# Patient Record
Sex: Female | Born: 1950 | Race: White | Hispanic: No | Marital: Single | Smoking: Never smoker
Health system: Southern US, Community
[De-identification: ages and names within clinical notes are randomized; demographics above are authoritative.]

## PROBLEM LIST (undated history)

## (undated) DIAGNOSIS — I2489 Other forms of acute ischemic heart disease: Secondary | ICD-10-CM

## (undated) DIAGNOSIS — C50919 Malignant neoplasm of unspecified site of unspecified female breast: Secondary | ICD-10-CM

## (undated) DIAGNOSIS — I5021 Acute systolic (congestive) heart failure: Secondary | ICD-10-CM

## (undated) HISTORY — DX: Acute systolic (congestive) heart failure: I50.21

## (undated) HISTORY — DX: Other forms of acute ischemic heart disease: I24.89

---

## 1997-05-29 HISTORY — PX: LAPAROSCOPIC TOTAL HYSTERECTOMY: SUR800

## 1997-05-29 HISTORY — PX: BILATERAL TOTAL MASTECTOMY WITH AXILLARY LYMPH NODE DISSECTION: SHX6364

## 2021-03-10 ENCOUNTER — Other Ambulatory Visit (HOSPITAL_BASED_OUTPATIENT_CLINIC_OR_DEPARTMENT_OTHER): Payer: Self-pay

## 2021-03-10 ENCOUNTER — Ambulatory Visit: Payer: Self-pay | Attending: Internal Medicine

## 2021-03-10 DIAGNOSIS — Z23 Encounter for immunization: Secondary | ICD-10-CM

## 2021-03-10 MED ORDER — MODERNA COVID-19 BIVAL BOOSTER 50 MCG/0.5ML IM SUSP
INTRAMUSCULAR | 0 refills | Status: DC
  Filled 2021-03-10: qty 0.5, 1d supply, fill #0

## 2021-03-10 NOTE — Progress Notes (Signed)
   Covid-19 Vaccination Clinic  Name:  Theresa Hancock    MRN: [id number] DOB: [birth year]  03/10/2021  [name] was observed post Covid-19 immunization for 15 minutes without incident. She was provided with Vaccine Information Sheet and instruction to access the V-Safe system.   [name] was instructed to call 911 with any severe reactions post vaccine: Difficulty breathing  Swelling of face and throat  A fast heartbeat  A bad rash all over body  Dizziness and weakness

## 2021-03-14 ENCOUNTER — Other Ambulatory Visit (HOSPITAL_BASED_OUTPATIENT_CLINIC_OR_DEPARTMENT_OTHER): Payer: Self-pay

## 2021-10-13 ENCOUNTER — Emergency Department (HOSPITAL_COMMUNITY): Payer: Medicare Other

## 2021-10-13 ENCOUNTER — Other Ambulatory Visit: Payer: Self-pay

## 2021-10-13 ENCOUNTER — Observation Stay (HOSPITAL_COMMUNITY): Payer: Medicare Other

## 2021-10-13 ENCOUNTER — Encounter (HOSPITAL_COMMUNITY): Payer: Self-pay | Admitting: Internal Medicine

## 2021-10-13 ENCOUNTER — Inpatient Hospital Stay (HOSPITAL_COMMUNITY)
Admission: EM | Admit: 2021-10-13 | Discharge: 2021-10-16 | DRG: 280 | Disposition: A | Discharge status: Home / Self Care | Payer: Medicare Other | Attending: Internal Medicine | Admitting: Internal Medicine

## 2021-10-13 DIAGNOSIS — I2489 Other forms of acute ischemic heart disease: Secondary | ICD-10-CM

## 2021-10-13 DIAGNOSIS — Z9013 Acquired absence of bilateral breasts and nipples: Secondary | ICD-10-CM

## 2021-10-13 DIAGNOSIS — I4721 Torsades de pointes: Secondary | ICD-10-CM | POA: Diagnosis not present

## 2021-10-13 DIAGNOSIS — I447 Left bundle-branch block, unspecified: Secondary | ICD-10-CM | POA: Diagnosis present

## 2021-10-13 DIAGNOSIS — Z8249 Family history of ischemic heart disease and other diseases of the circulatory system: Secondary | ICD-10-CM

## 2021-10-13 DIAGNOSIS — Z803 Family history of malignant neoplasm of breast: Secondary | ICD-10-CM

## 2021-10-13 DIAGNOSIS — E876 Hypokalemia: Secondary | ICD-10-CM | POA: Diagnosis not present

## 2021-10-13 DIAGNOSIS — I248 Other forms of acute ischemic heart disease: Secondary | ICD-10-CM

## 2021-10-13 DIAGNOSIS — T501X5A Adverse effect of loop [high-ceiling] diuretics, initial encounter: Secondary | ICD-10-CM | POA: Diagnosis not present

## 2021-10-13 DIAGNOSIS — J9601 Acute respiratory failure with hypoxia: Secondary | ICD-10-CM

## 2021-10-13 DIAGNOSIS — E785 Hyperlipidemia, unspecified: Secondary | ICD-10-CM | POA: Diagnosis present

## 2021-10-13 DIAGNOSIS — Z66 Do not resuscitate: Secondary | ICD-10-CM | POA: Diagnosis present

## 2021-10-13 DIAGNOSIS — I21A1 Myocardial infarction type 2: Secondary | ICD-10-CM | POA: Diagnosis present

## 2021-10-13 DIAGNOSIS — I160 Hypertensive urgency: Secondary | ICD-10-CM | POA: Diagnosis present

## 2021-10-13 DIAGNOSIS — R778 Other specified abnormalities of plasma proteins: Secondary | ICD-10-CM

## 2021-10-13 DIAGNOSIS — D72829 Elevated white blood cell count, unspecified: Secondary | ICD-10-CM | POA: Diagnosis not present

## 2021-10-13 DIAGNOSIS — I340 Nonrheumatic mitral (valve) insufficiency: Secondary | ICD-10-CM | POA: Diagnosis present

## 2021-10-13 DIAGNOSIS — I5043 Acute on chronic combined systolic (congestive) and diastolic (congestive) heart failure: Secondary | ICD-10-CM | POA: Diagnosis present

## 2021-10-13 DIAGNOSIS — I161 Hypertensive emergency: Secondary | ICD-10-CM | POA: Diagnosis present

## 2021-10-13 DIAGNOSIS — I110 Hypertensive heart disease with heart failure: Secondary | ICD-10-CM | POA: Diagnosis not present

## 2021-10-13 DIAGNOSIS — Z853 Personal history of malignant neoplasm of breast: Secondary | ICD-10-CM

## 2021-10-13 DIAGNOSIS — R7989 Other specified abnormal findings of blood chemistry: Secondary | ICD-10-CM

## 2021-10-13 DIAGNOSIS — R0609 Other forms of dyspnea: Secondary | ICD-10-CM

## 2021-10-13 DIAGNOSIS — I420 Dilated cardiomyopathy: Secondary | ICD-10-CM | POA: Diagnosis present

## 2021-10-13 DIAGNOSIS — I5021 Acute systolic (congestive) heart failure: Secondary | ICD-10-CM

## 2021-10-13 HISTORY — DX: Malignant neoplasm of unspecified site of unspecified female breast: C50.919

## 2021-10-13 LAB — COMPREHENSIVE METABOLIC PANEL
ALT: 59 U/L — ABNORMAL HIGH (ref 0–44)
AST: 78 U/L — ABNORMAL HIGH (ref 15–41)
Albumin: 4.3 g/dL (ref 3.5–5.0)
Alkaline Phosphatase: 81 U/L (ref 38–126)
Anion gap: 11 (ref 5–15)
BUN: 16 mg/dL (ref 8–23)
CO2: 22 mmol/L (ref 22–32)
Calcium: 9 mg/dL (ref 8.9–10.3)
Chloride: 110 mmol/L (ref 98–111)
Creatinine, Ser: 0.64 mg/dL (ref 0.44–1.00)
GFR, Estimated: >60 mL/min (ref >60)
Glucose, Bld: 174 mg/dL — ABNORMAL HIGH (ref 70–99)
Potassium: 4.3 mmol/L (ref 3.5–5.1)
Sodium: 143 mmol/L (ref 135–145)
Total Bilirubin: 0.5 mg/dL (ref 0.3–1.2)
Total Protein: 7.3 g/dL (ref 6.5–8.1)

## 2021-10-13 LAB — TROPONIN I (HIGH SENSITIVITY)
Troponin I (High Sensitivity): 10 ng/L (ref <18)
Troponin I (High Sensitivity): 104 ng/L (ref <18)
Troponin I (High Sensitivity): 258 ng/L (ref <18)
Troponin I (High Sensitivity): 296 ng/L (ref <18)
Troponin I (High Sensitivity): 335 ng/L (ref <18)
Troponin I (High Sensitivity): 297 ng/L (ref <18)

## 2021-10-13 LAB — LIPID PANEL
Cholesterol: 216 mg/dL — ABNORMAL HIGH (ref 0–200)
HDL: 79 mg/dL (ref >40)
LDL Cholesterol: 122 mg/dL — ABNORMAL HIGH (ref 0–99)
Total CHOL/HDL Ratio: 2.7 RATIO
Triglycerides: 77 mg/dL (ref <150)
VLDL: 15 mg/dL (ref 0–40)

## 2021-10-13 LAB — TSH: TSH: 0.852 u[IU]/mL (ref 0.350–4.500)

## 2021-10-13 LAB — HEMOGLOBIN A1C
Hgb A1c MFr Bld: 5.5 % (ref 4.8–5.6)
Mean Plasma Glucose: 111.15 mg/dL

## 2021-10-13 LAB — CBC
HCT: 53.4 % — ABNORMAL HIGH (ref 36.0–46.0)
Hemoglobin: 17.2 g/dL — ABNORMAL HIGH (ref 12.0–15.0)
MCH: 31.7 pg (ref 26.0–34.0)
MCHC: 32.2 g/dL (ref 30.0–36.0)
MCV: 98.3 fL (ref 80.0–100.0)
Platelets: 293 10*3/uL (ref 150–400)
RBC: 5.43 MIL/uL — ABNORMAL HIGH (ref 3.87–5.11)
RDW: 13.2 % (ref 11.5–15.5)
WBC: 12.6 10*3/uL — ABNORMAL HIGH (ref 4.0–10.5)
nRBC: 0 % (ref 0.0–0.2)

## 2021-10-13 LAB — HEPARIN LEVEL (UNFRACTIONATED): Heparin Unfractionated: 1.09 IU/mL — ABNORMAL HIGH (ref 0.30–0.70)

## 2021-10-13 LAB — ECHOCARDIOGRAM COMPLETE: S' Lateral: 3 cm

## 2021-10-13 LAB — HIV ANTIBODY (ROUTINE TESTING W REFLEX): HIV Screen 4th Generation wRfx: NONREACTIVE

## 2021-10-13 LAB — BRAIN NATRIURETIC PEPTIDE: B Natriuretic Peptide: 54 pg/mL (ref 0.0–100.0)

## 2021-10-13 MED ORDER — SODIUM CHLORIDE 0.9 % IV SOLN
INTRAVENOUS | Status: DC
Start: 2021-10-14 — End: 2021-10-14

## 2021-10-13 MED ORDER — FUROSEMIDE 10 MG/ML IJ SOLN
40.0000 mg | Freq: Once | INTRAMUSCULAR | Status: AC
  Administered 2021-10-13: 40 mg via INTRAVENOUS
  Filled 2021-10-13: qty 4

## 2021-10-13 MED ORDER — CARVEDILOL 6.25 MG PO TABS
6.2500 mg | ORAL_TABLET | Freq: Two times a day (BID) | ORAL | Status: DC
  Administered 2021-10-14 – 2021-10-15 (×4): 6.25 mg via ORAL
  Filled 2021-10-13 (×4): qty 1

## 2021-10-13 MED ORDER — RIVAROXABAN 10 MG PO TABS
10.0000 mg | ORAL_TABLET | Freq: Every day | ORAL | Status: DC
  Administered 2021-10-13: 10 mg via ORAL
  Filled 2021-10-13: qty 1

## 2021-10-13 MED ORDER — SODIUM CHLORIDE 0.9 % IV SOLN
500.0000 mg | INTRAVENOUS | Status: DC
  Administered 2021-10-13 – 2021-10-15 (×3): 500 mg via INTRAVENOUS
  Filled 2021-10-13 (×4): qty 5

## 2021-10-13 MED ORDER — SODIUM CHLORIDE 0.9% FLUSH: 3.0000 mL | INTRAVENOUS | Status: DC | PRN

## 2021-10-13 MED ORDER — AMLODIPINE BESYLATE 5 MG PO TABS
5.0000 mg | ORAL_TABLET | Freq: Every day | ORAL | Status: DC
Start: 2021-10-13 — End: 2021-10-13
  Administered 2021-10-13: 5 mg via ORAL
  Filled 2021-10-13: qty 1

## 2021-10-13 MED ORDER — HEPARIN BOLUS VIA INFUSION
4000.0000 [IU] | Freq: Once | INTRAVENOUS | Status: AC
  Administered 2021-10-13: 4000 [IU] via INTRAVENOUS
  Filled 2021-10-13: qty 4000

## 2021-10-13 MED ORDER — IRBESARTAN 300 MG PO TABS
150.0000 mg | ORAL_TABLET | Freq: Every day | ORAL | Status: DC
  Administered 2021-10-13 – 2021-10-14 (×2): 150 mg via ORAL
  Filled 2021-10-13 (×2): qty 1

## 2021-10-13 MED ORDER — SODIUM CHLORIDE 0.9% FLUSH
3.0000 mL | Freq: Two times a day (BID) | INTRAVENOUS | Status: DC
Start: 2021-10-13 — End: 2021-10-15

## 2021-10-13 MED ORDER — SODIUM CHLORIDE 0.9 % IV SOLN
2.0000 g | Freq: Once | INTRAVENOUS | Status: AC
  Administered 2021-10-13: 2 g via INTRAVENOUS
  Filled 2021-10-13: qty 20

## 2021-10-13 MED ORDER — HEPARIN (PORCINE) 25000 UT/250ML-% IV SOLN
1000.0000 [IU]/h | INTRAVENOUS | Status: DC
  Administered 2021-10-13: 850 [IU]/h via INTRAVENOUS
  Filled 2021-10-13: qty 250

## 2021-10-13 MED ORDER — IOHEXOL 350 MG/ML SOLN
50.0000 mL | Freq: Once | INTRAVENOUS | Status: AC | PRN
Start: 2021-10-13 — End: 2021-10-13
  Administered 2021-10-13: 50 mL via INTRAVENOUS

## 2021-10-13 MED ORDER — ASPIRIN 81 MG PO CHEW
81.0000 mg | CHEWABLE_TABLET | ORAL | Status: AC
  Administered 2021-10-14: 81 mg via ORAL
  Filled 2021-10-13: qty 1

## 2021-10-13 MED ORDER — ATORVASTATIN CALCIUM 80 MG PO TABS
80.0000 mg | ORAL_TABLET | Freq: Every day | ORAL | Status: DC
  Administered 2021-10-13 – 2021-10-14 (×2): 80 mg via ORAL
  Filled 2021-10-13 (×2): qty 1

## 2021-10-13 MED ORDER — SODIUM CHLORIDE 0.9 % IV SOLN: 250.0000 mL | INTRAVENOUS | Status: DC | PRN

## 2021-10-13 MED ORDER — ASPIRIN 81 MG PO TBEC
81.0000 mg | DELAYED_RELEASE_TABLET | Freq: Every day | ORAL | Status: DC
  Administered 2021-10-13: 81 mg via ORAL
  Filled 2021-10-13: qty 1

## 2021-10-13 MED ORDER — ASPIRIN 81 MG PO TBEC
81.0000 mg | DELAYED_RELEASE_TABLET | Freq: Every day | ORAL | Status: DC
Start: 2021-10-15 — End: 2021-10-16
  Administered 2021-10-15 – 2021-10-16 (×2): 81 mg via ORAL
  Filled 2021-10-13 (×2): qty 1

## 2021-10-13 MED ORDER — NITROGLYCERIN IN D5W 200-5 MCG/ML-% IV SOLN
0.0000 ug/min | INTRAVENOUS | Status: DC
  Administered 2021-10-13: 10 ug/min via INTRAVENOUS
  Filled 2021-10-13: qty 250

## 2021-10-13 NOTE — ED Triage Notes (Signed)
Patient reports worsening SOB with wheezing / chest tightness and dry cough onset this morning , O2 sat=87% room air at arrival .

## 2021-10-13 NOTE — Progress Notes (Signed)
ANTICOAGULATION CONSULT NOTE - Follow Up Consult  Pharmacy Consult for Heparin Indication: chest pain/ACS  No Known Allergies  Patient Measurements:   Heparin Dosing Weight: 71.5 kg  Vital Signs: Temp: 98.6 F (37 C) (05/18 1452) Temp Source: Oral (05/18 1452) BP: 145/80 (05/18 1400) Pulse Rate: 89 (05/18 1400)  Labs: Recent Labs    10/13/21 0627 10/13/21 0832 10/13/21 1159  HGB 17.2*  --   --   HCT 53.4*  --   --   PLT 293  --   --   CREATININE 0.64  --   --   TROPONINIHS 10 104* 258*    CrCl cannot be calculated (Unknown ideal weight.).   Assessment: 71 yo female presented 10/13/2021 for SOB. Pharmacy consulted to dose heparin for ACS. Plan for cath tomorrow. Patient started on xarelto '10mg'$  daily for DVT prophylaxis - last dose this AM. No anticoagulation prior to admission.   Goal of Therapy:  Heparin level 0.3-0.7 units/ml Monitor platelets by anticoagulation protocol: Yes   Plan:  Heparin 4000 unit bolus followed by heparin 850 units/hr  Check 6 hr heparin level Monitor heparin level, CBC and s/s of bleeding daily  Follow cath tomorrow   Cristela Felt, PharmD, BCPS Clinical Pharmacist 10/13/2021 3:59 PM

## 2021-10-13 NOTE — H&P (Signed)
Date: 10/13/2021               Patient Name:  Theresa Hancock MRN: [id number]  DOB: 1951/04/26 Age / Sex: 71 y.o., female   PCP: Pcp, No         Medical Service: Internal Medicine Teaching Service         Attending Physician: Dr. Lottie Mussel, MD    First Contact: Idamae Schuller MD Pager: Teodora Medici [phone]  Second Contact: Linwood Dibbles, MD Pager: PA [phone]       After Hours (After 5p/  First Contact Pager: [phone]  weekends / holidays): Second Contact Pager: [phone]   SUBJECTIVE   Chief Complaint: shortness of breath  History of Present Illness: Theresa Hancock is a 71 year old female who does not follow regularly with a PCP who presents with acute onset shortness of breath.  Patient had 1 episode of shortness of breath 5 days ago and she thought it was because she was more tired.  She feels better the next day and was doing well until she woke up this morning.  She was having difficulty breathing when she woke up this morning and felt like she had fluid on her chest. She denies any chest pain .  No recent sick contacts, no fever, no cough, no history of HTN, or similar episodes.   ED Course: Hypoxic and hypertensive so patient was placed on nitro drip and BiPAP.  Patient had x-ray and CT angio chest, both consistent with bibasilar opacities which could represent edema or multifocal pneumonia. Patient weaned off Bipap and on 3 L supplemental oxygen on my interview.   Review of Systems  Constitutional:  Negative for chills and fever.  Eyes:  Negative for blurred vision and double vision.  Respiratory:  Positive for shortness of breath. Negative for cough and sputum production.   Cardiovascular:  Negative for chest pain, palpitations and leg swelling.  Gastrointestinal:  Negative for constipation and diarrhea.  Genitourinary:  Negative for frequency and urgency.  Musculoskeletal:  Negative for falls and joint pain.  Neurological:  Negative for dizziness and headaches.   Psychiatric/Behavioral:  Negative for depression and substance abuse.      Meds:  Current Meds  Medication Sig   diphenhydrAMINE (SOMINEX) 25 MG tablet Take 50 mg by mouth at bedtime as needed for sleep.    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 Nephew also lives with her.  Occupation: Case Freight forwarder.  Level of Function: Own ADL's and IADL's PCP: She is going to establish with [facility] [employer], no currently established with anyone Substances: Social History   Tobacco Use   Smoking status: Never  Substance Use Topics   Alcohol use: Yes    Alcohol/week: 7.0 standard drinks    Types: 7 Glasses of wine per week   Drug use: Never      Family History:  Family History  Problem Relation Age of Onset   Breast cancer Mother    Heart attack Father    Heart failure Brother      Allergies: Allergies as of 10/13/2021   (No Known Allergies)    Review of Systems: A complete ROS was negative except as per HPI.   OBJECTIVE:  Physical Exam: Blood pressure 131/73, pulse 87, temperature 97.8 F (36.6 C), temperature source Oral, resp. rate (!) 21, SpO2 97 %.   General: NAD, nl appearance HE: EOMI, Conjunctivae normal ENT: No congestion, no rhinorrhea, no exudate or erythema  Cardiovascular: Normal rate, regular rhythm.  No murmurs, rubs, or gallops Pulmonary : on 3 L supplemental oxygen, talking in complete sentence, unlabored, Rales in lower and middle lung fields Abdominal: soft, nontender,  bowel sounds present Musculoskeletal: no swelling , deformity, injury ,or tenderness in extremities, Skin: Warm, dry , no bruising, erythema, or rash Psychiatric/Behavioral:  normal mood, normal behavior   EKG: personally  reviewed my interpretation is NSR, LBBB. No prior comparison before this admission   ASSESSMENT & PLAN:    Assessment & Plan by Problem: Principal Problem:   Hypertensive urgency   Theresa Hancock is a 71 y.o. female who does not follow regularly with a PCP who presents with acute dyspnea.   #Severe asymptomatic hypertension -  I expect acute onset shortness of breath in setting of flash pulmonary edema from elevated blood pressure - Start olmesartan 20 mg and amlodipine 5 mg. Patient had increased response to oral medication, RN is stopping nitro drip. Goal SBP 160  and at this time goal to lower over period of days.  - Ordered TEE ,shows global hypokinesis. Mild mitral regurgitation. EF 40- 45% - Monitor on Tele - Lasix 40 mg once  #Elevated Troponin Patient has not experienced chest pain. HS Troponin initially normal and has trended up 100> 258. Expect troponin leak from hypertensive episode so have not started Heparin or given Asprin .  Repeat EKG did not show acute ischemic changes.  - Consult cardiology for ischemic eval. - Continue to trend troponin and repeat EKG - Lipid panel - hemoglobin a1c  Diet: regular  VTE: NOAC IVF: None,None Code: DNR   Signed: Drema Pry, MD Internal Medicine Resident PGY-3 Pager: [phone]  10/13/2021, 2:05 PM

## 2021-10-13 NOTE — Consult Note (Addendum)
Cardiology Consultation:   Patient ID: Theresa Hancock MRN: [id number]; DOB: Dec 13, 1950  Admit date: 10/13/2021 Date of Consult: 10/13/2021  PCP:  Merryl Hacker, No   [facility] Providers Cardiologist:  None      Patient Profile:   Theresa Hancock is a 71 y.o. female with a hx of breast cancer, who does not regularly follow with a PCP, who is being seen 10/13/2021 for the evaluation of new CHF, elevated troponin at the request of Dr. Cain Sieve.  History of Present Illness:   [name] is a 71 year old female with above medical history. Per chart review, patient does not regularly see a PCP or a cardiologist. Does not have any known cardiac history.   Patient presented to the ED on 5/18 complaining of worsening OSOB with chest tightness. Patient was hypoxic with O2 saturatoin 87% on arrival, complained multiple times that she "couldn't breath" and was started on bi-pap. Later transitioned to [facility]. Admitted to the hosptialist service. Labs in the ED showed Na 143, K 4.3, creatinine 0.64, WBC 12.6, hemoglobin 17.2, platelets 293. HsTn 10>>104>>258. BNP normal at 54.  EKG showed sinus tachycardia, HR 120, LBBB.  CTA chest negative for PE, possible multifocal PNA vs edema.  CXR concerning for acute bronchitis and developing multilobar bilateral bronchopneumonia.  On interview, patient reports that she woke up this morning and felt like she could not breath. Also had substernal chest tightness. Denied any outright chest pain, but did notice the tightness. Denies having a cough. Denies any fever, chills, n/v/d, ankle swelling. Denies noticing any SOB on exertion or chest pain on exertion recently. Denies any orthopnea. Denies any past cardiac history or history of breathing issues. Does not see a doctor regularly .   Past Medical History:  Diagnosis Date   Breast cancer [facility])     Past Surgical History:  Procedure Laterality Date   BILATERAL TOTAL MASTECTOMY WITH AXILLARY LYMPH NODE DISSECTION  Bilateral 1999   LAPAROSCOPIC TOTAL HYSTERECTOMY Bilateral 1999     Home Medications:  Prior to Admission medications   Medication Sig Start Date End Date Taking? Authorizing Provider  diphenhydrAMINE (SOMINEX) 25 MG tablet Take 50 mg by mouth at bedtime as needed for sleep.   Yes [provider]    Inpatient Medications: Scheduled Meds:  amLODipine  5 mg Oral Daily   irbesartan  150 mg Oral Daily   rivaroxaban  10 mg Oral Daily   Continuous Infusions:  azithromycin Stopped (10/13/21 9735)   nitroGLYCERIN Stopped (10/13/21 1303)   PRN Meds:   Allergies:   No Known Allergies  Social History:   Social History   Socioeconomic History   Marital status: Single    Spouse name: Not on file   Number of children: Not on file   Years of education: Not on file   Highest education level: Not on file  Occupational History   Not on file  Tobacco Use   Smoking status: Never   Smokeless tobacco: Never  Substance and Sexual Activity   Alcohol use: Yes    Alcohol/week: 7.0 standard drinks    Types: 7 Glasses of wine per week   Drug use: Never   Sexual activity: Not Currently  Other Topics Concern   Not on file  Social History Narrative   Not on file   Social Determinants of Health   Financial Resource Strain: Not on file  Food Insecurity: Not on file  Transportation Needs: Not on file  Physical Activity: Not on  file  Stress: Not on file  Social Connections: Not on file  Intimate Partner Violence: Not on file    Family History:    Family History  Problem Relation Age of Onset   Breast cancer Mother    Heart attack Father    Heart failure Brother      ROS:  Please see the history of present illness.   All other ROS reviewed and negative.     Physical Exam/Data:   Vitals:   10/13/21 1245 10/13/21 1300 10/13/21 1315 10/13/21 1316  BP: 130/85 125/70 134/69   Pulse: 89 88 87   Resp: 17 (!) 22 20   Temp:    98 F (36.7 C)  TempSrc:    Oral  SpO2:  97% 98% 98%     Intake/Output Summary (Last 24 hours) at 10/13/2021 1437 Last data filed at 10/13/2021 1303 Gross per 24 hour  Intake 405.23 ml  Output --  Net 405.23 ml       View : No data to display.           There is no height or weight on file to calculate BMI.  General:  Well nourished, well developed, in no acute distress. Sitting upright in the chair wearing nasal cannula with 3 L /min supplemental oxygen  HEENT: normal Neck: no JVD Vascular: Radial pulses 2+ bilaterally Cardiac:  normal S1, S2; RRR; no murmur  Lungs:  clear to auscultation bilaterally, no wheezing, rhonchi or rales  Abd: soft, nontender, no hepatomegaly  Ext: trace nonpitting edema Musculoskeletal:  No deformities, BUE and BLE strength normal and equal Skin: warm and dry  Neuro:  CNs 2-12 intact, no focal abnormalities noted Psych:  Normal affect   EKG:  The EKG was personally reviewed and demonstrates:  Sinus rhythm, LBBB Telemetry:  Telemetry was personally reviewed and demonstrates:    Relevant CV Studies:  Echocardiogram 10/13/21  1 . Global hypokinesis abnormal septal motion . Left ventricular ejection  fraction, by estimation, is 40 to 45%. The left ventricle has mildly  decreased function. The left ventricle demonstrates global hypokinesis.  The left ventricular internal cavity  size was mildly dilated. Left ventricular diastolic parameters are  consistent with Grade I diastolic dysfunction (impaired relaxation).   2. Right ventricular systolic function is normal. The right ventricular  size is normal.   3. Left atrial size was mildly dilated.   4. The mitral valve is abnormal. Mild mitral valve regurgitation. No  evidence of mitral stenosis.   5. The aortic valve is tricuspid. Aortic valve regurgitation is not  visualized. No aortic stenosis is present.   6. The inferior vena cava is dilated in size with >50% respiratory  variability, suggesting right atrial pressure of 8 mmHg.    Laboratory Data:  High Sensitivity Troponin:   Recent Labs  Lab 10/13/21 0627 10/13/21 0832 10/13/21 1159  TROPONINIHS 10 104* 258*     Chemistry Recent Labs  Lab 10/13/21 0627  NA 143  K 4.3  CL 110  CO2 22  GLUCOSE 174*  BUN 16  CREATININE 0.64  CALCIUM 9.0  GFRNONAA >60  ANIONGAP 11    Recent Labs  Lab 10/13/21 0627  PROT 7.3  ALBUMIN 4.3  AST 78*  ALT 59*  ALKPHOS 81  BILITOT 0.5   Lipids  Recent Labs  Lab 10/13/21 1159  CHOL 216*  TRIG 77  HDL 79  LDLCALC 122*  CHOLHDL 2.7    Hematology Recent Labs  Lab 10/13/21  0627  WBC 12.6*  RBC 5.43*  HGB 17.2*  HCT 53.4*  MCV 98.3  MCH 31.7  MCHC 32.2  RDW 13.2  PLT 293   Thyroid  Recent Labs  Lab 10/13/21 1159  TSH 0.852    BNP Recent Labs  Lab 10/13/21 0627  BNP 54.0    DDimer No results for input(s): DDIMER in the last 168 hours.   Radiology/Studies:  CT Angio Chest Pulmonary Embolism (PE) W or WO Contrast  Result Date: 10/13/2021 CLINICAL DATA:  Shortness of breath. EXAM: CT ANGIOGRAPHY CHEST WITH CONTRAST TECHNIQUE: Multidetector CT imaging of the chest was performed using the standard protocol during bolus administration of intravenous contrast. Multiplanar CT image reconstructions and MIPs were obtained to evaluate the vascular anatomy. RADIATION DOSE REDUCTION: This exam was performed according to the departmental dose-optimization program which includes automated exposure control, adjustment of the mA and/or kV according to patient size and/or use of iterative reconstruction technique. CONTRAST:  16m OMNIPAQUE IOHEXOL 350 MG/ML SOLN COMPARISON:  None Available. FINDINGS: Cardiovascular: Satisfactory opacification of the pulmonary arteries to the segmental level. No evidence of pulmonary embolism. Normal heart size. No pericardial effusion. Mediastinum/Nodes: No enlarged mediastinal, hilar, or axillary lymph nodes. Thyroid gland, trachea, and esophagus demonstrate no significant  findings. Lungs/Pleura: Minimal bilateral pleural effusions are noted. No pneumothorax is noted. Bibasilar opacities are noted concerning for edema or possibly multifocal pneumonia. Upper Abdomen: No acute abnormality. Musculoskeletal: No chest wall abnormality. No acute or significant osseous findings. Review of the MIP images confirms the above findings. IMPRESSION: No definite evidence of pulmonary embolus. Bibasilar opacities are noted concerning for edema or possibly multifocal pneumonia. Small pleural effusions are noted. Electronically Signed   By: JMarijo ConceptionM.D.   On: 10/13/2021 08:20   DG Chest Port 1 View  Result Date: 10/13/2021 CLINICAL DATA:  71year old female with history of shortness of breath. EXAM: PORTABLE CHEST 1 VIEW COMPARISON:  No priors. FINDINGS: Lung volumes are normal. Widespread areas of interstitial prominence, diffuse peribronchial cuffing and ill-defined opacities are noted throughout the lungs bilaterally, most severe throughout the mid to lower lungs. No pleural effusions. No pneumothorax. Pulmonary vasculature does not appearing origin. Heart size is upper limits of normal. Upper mediastinal contours are within normal limits. Atherosclerotic calcifications in the thoracic aorta. IMPRESSION: 1. The appearance the chest is concerning for acute bronchitis and developing multilobar bilateral bronchopneumonia, as above. 2. Aortic atherosclerosis. Electronically Signed   By: DVinnie LangtonM.D.   On: 10/13/2021 06:18   ECHOCARDIOGRAM COMPLETE  Result Date: 10/13/2021    ECHOCARDIOGRAM REPORT   Patient Name:   CJULYANNA SCHOLLEDate of Exam: 10/13/2021 Medical Rec #:  [id number]      Height:       62.0 in Accession #:    [id number]     Weight:       185.0 lb Date of Birth:  104/16/1952     BSA:          1.849 m Patient Age:    89years        BP:           136/77 mmHg Patient Gender: F               HR:           87 bpm. Exam Location:  Inpatient Procedure: 2D Echo, Color  Doppler and Cardiac Doppler Indications:    R06.9 DOE  History:  Patient has no prior history of Echocardiogram examinations.  Sonographer:    Raquel Sarna Senior RDCS Referring Phys: [id number] JULIE MACHEN  Sonographer Comments: Poor parasternal window due to scar tissue interference IMPRESSIONS  1. Global hypokinesis abnormal septal motion . Left ventricular ejection fraction, by estimation, is 40 to 45%. The left ventricle has mildly decreased function. The left ventricle demonstrates global hypokinesis. The left ventricular internal cavity size was mildly dilated. Left ventricular diastolic parameters are consistent with Grade I diastolic dysfunction (impaired relaxation).  2. Right ventricular systolic function is normal. The right ventricular size is normal.  3. Left atrial size was mildly dilated.  4. The mitral valve is abnormal. Mild mitral valve regurgitation. No evidence of mitral stenosis.  5. The aortic valve is tricuspid. Aortic valve regurgitation is not visualized. No aortic stenosis is present.  6. The inferior vena cava is dilated in size with >50% respiratory variability, suggesting right atrial pressure of 8 mmHg. FINDINGS  Left Ventricle: Global hypokinesis abnormal septal motion. Left ventricular ejection fraction, by estimation, is 40 to 45%. The left ventricle has mildly decreased function. The left ventricle demonstrates global hypokinesis. The left ventricular internal cavity size was mildly dilated. There is no left ventricular hypertrophy. Left ventricular diastolic parameters are consistent with Grade I diastolic dysfunction (impaired relaxation). Right Ventricle: The right ventricular size is normal. No increase in right ventricular wall thickness. Right ventricular systolic function is normal. Left Atrium: Left atrial size was mildly dilated. Right Atrium: Right atrial size was normal in size. Pericardium: There is no evidence of pericardial effusion. Mitral Valve: The mitral valve is  abnormal. There is mild thickening of the mitral valve leaflet(s). Mild mitral valve regurgitation. No evidence of mitral valve stenosis. Tricuspid Valve: The tricuspid valve is normal in structure. Tricuspid valve regurgitation is not demonstrated. No evidence of tricuspid stenosis. Aortic Valve: The aortic valve is tricuspid. Aortic valve regurgitation is not visualized. No aortic stenosis is present. Pulmonic Valve: The pulmonic valve was normal in structure. Pulmonic valve regurgitation is not visualized. No evidence of pulmonic stenosis. Aorta: The aortic root is normal in size and structure. Venous: The inferior vena cava is dilated in size with greater than 50% respiratory variability, suggesting right atrial pressure of 8 mmHg. IAS/Shunts: No atrial level shunt detected by color flow Doppler.  LEFT VENTRICLE PLAX 2D LVIDd:         4.40 cm   Diastology LVIDs:         3.00 cm   LV e' medial:  5.98 cm/s LV PW:         0.90 cm   LV e' lateral: 7.94 cm/s LV IVS:        0.90 cm LVOT diam:     2.00 cm LV SV:         60 LV SV Index:   32 LVOT Area:     3.14 cm  RIGHT VENTRICLE RV S prime:     12.20 cm/s TAPSE (M-mode): 2.4 cm LEFT ATRIUM             Index        RIGHT ATRIUM           Index LA diam:        4.00 cm 2.16 cm/m   RA Area:     13.80 cm LA Vol (A2C):   43.0 ml 23.25 ml/m  RA Volume:   32.80 ml  17.74 ml/m LA Vol (A4C):   46.3 ml 25.04 ml/m LA  Biplane Vol: 47.4 ml 25.63 ml/m  AORTIC VALVE LVOT Vmax:   101.00 cm/s LVOT Vmean:  69.100 cm/s LVOT VTI:    0.190 m  AORTA Ao Root diam: 2.70 cm Ao Asc diam:  3.10 cm  SHUNTS Systemic VTI:  0.19 m Systemic Diam: 2.00 cm Jenkins Rouge MD Electronically signed by Jenkins Rouge MD Signature Date/Time: 10/13/2021/11:46:52 AM    Final      Assessment and Plan:   Elevated Troponin  - hsTn 10>>104>>258  - Patient reports acute onset of SOB and chest tightness this AM  - Patient noted to have high BP on arrival, possible this is demand ischemia/troponin leak due  to htn urgency. However I am concerned that her trops are trending upward and patient does have uncontrolled risk factors (HTN, HLD, possible DM). Also concerned about acute onset of symptoms this AM, newly reduced EF on echo with RWMA - EKG with LBBB, difficult to evaluate for ischemic changes  - Start IV heparin, daily ASA, statin  - Plan for R/L heart cath tomorrow   HTN urgency  - Initial BP 232/140 - Patient started on amlodipine, irbesartan, IV nitro  - BP improving, most recently 145/80  Acute HFrEF  - Echo this admission showed global hypokinesis with abnormal septal motion, LVEF 62-95%, grade I diastolic dysfunction - Unknown etiology-- possibly secondary to uncontrolled HTN ? Brother also had history of HF (thought to be genetic)  - Continue ARB  - Given lasix '40mg'$  IV x1 dose, appears overall euvolemic on exam  - Needs ischemic eval. Given elevated/upward trending troponin, Plan for L/R heart cath this admission   HLD  - LDL 122, HDL 79, triglycerides 77  - Start lipitor 80 mg daily. Needs LFTs and lipid panel in 8 weeks    Risk Assessment/Risk Scores:    New York Heart Association (NYHA) Functional Class NYHA Class II    For questions or updates, please contact [facility] Please consult [email or web address] for contact info under    Signed, Margie Billet, PA-C  10/13/2021 2:37 PM   Patient seen and examined.  Agree with below documentation.  Ms. Binion is a 71 year old female with a history of breast cancer who we are consulted for evaluation of heart failure and troponin elevation at the request of Dr. Cain Sieve.  She does not have regular medical follow-up.  She presented to the ED today with complaints of chest pain.  Reports she woke up this morning and felt short of breath and had chest pain that she describes as tightness in center of chest.  In the ED, initial vital signs notable for BP 232/140, pulse 134, SPO2 88% on room air.  She was started on BiPAP and  weaned to nasal cannula.  EKG showed sinus tachycardia, left bundle branch block.  Labs notable for creatinine 0.6, WBC 12.6, hemoglobin 17.2, platelets 293, BNP 54, troponin 10 > 104 > 258 > 296, LDL 122.  CTPA showed no PE, small pleural effusions, bibasilar opacities concerning for edema or possible multifocal pneumonia.  Echocardiogram showed EF 40 to 45%, normal RV function, mild MR.  On exam, patient is alert and oriented, regular rate and rhythm, no murmurs, lungs CTAB, no LE edema or JVD.  Suspect her troponin elevation represents demand ischemia in setting of her severely elevated blood pressure on admission.  However given her chest pain and also with new systolic dysfunction on echocardiogram, warrants ischemic evaluation.  Recommend RHC/LHC tomorrow.  She was started on amlodipine and irbesartan  for BP control.  We will switch amlodipine to carvedilol 6.25 mg twice daily given her systolic dysfunction.  Risks and benefits of cardiac catheterization have been discussed with the patient.  These include bleeding, infection, kidney damage, stroke, heart attack, death.  The patient understands these risks and is willing to proceed.   Donato Heinz, MD

## 2021-10-13 NOTE — Progress Notes (Signed)
Echocardiogram 2D Echocardiogram has been performed.  Oneal Deputy Anzley Dibbern RDCS 10/13/2021, 11:23 AM

## 2021-10-13 NOTE — Progress Notes (Signed)
Recent troponin now up to 335 at 19:31. Last was 296 at 16:30. Pt denies chest pain. Heparin drip infusing. Pt scheduled for left and right heart cath in AM. MD notified.

## 2021-10-13 NOTE — Progress Notes (Signed)
RT called by RN stating that BiPAP machine alarm sounding. RN removed pt from BiPAP and placed pt on 15L NRB. RT at bedside to find pt on 15L NRB SpO2 reading 100%. Pt states her breathing feels better. RT place pt on 3L [place]. Pt tolerating well at this time. BiPAP on stby in room. RT will continue to monitor pt.

## 2021-10-13 NOTE — ED Notes (Signed)
Switched to 5LPM [place] at this time. Pt maintains at 95-96% on the monitor. RT was at bedside. Will continue to monitor.

## 2021-10-13 NOTE — ED Notes (Signed)
Repeat EKG done at this time. Ordering MD messaged at this time that result is available.

## 2021-10-13 NOTE — ED Notes (Signed)
O2 decreased to 3LPM [place]. Pt tolerating well. O2 at 92-96% on the monitor.

## 2021-10-13 NOTE — ED Provider Notes (Signed)
[facility] Emergency Department Provider Note MRN:  [id number]  Arrival date & time: 10/13/21     Chief Complaint   Shortness of Breath   History of Present Illness   Theresa Hancock is a 71 y.o. year-old female with no pertinent past medical history presenting to the ED with chief complaint of shortness of breath.  Severe shortness of breath, sudden onset a few hours ago.  Had some more mild shortness of breath or dyspnea on exertion for the past 2 days.  Denies having any chest pain over the past few days, no fever, no cough.  No history of asthma or COPD, no history of heart problems.  Review of Systems  A thorough review of systems was obtained and all systems are negative except as noted in the HPI and PMH.   Patient's Health History   No past medical history on file.    No family history on file.  Social History   Socioeconomic History   Marital status: Single    Spouse name: Not on file   Number of children: Not on file   Years of education: Not on file   Highest education level: Not on file  Occupational History   Not on file  Tobacco Use   Smoking status: Not on file   Smokeless tobacco: Not on file  Substance and Sexual Activity   Alcohol use: Not on file   Drug use: Not on file   Sexual activity: Not on file  Other Topics Concern   Not on file  Social History Narrative   Not on file   Social Determinants of Health   Financial Resource Strain: Not on file  Food Insecurity: Not on file  Transportation Needs: Not on file  Physical Activity: Not on file  Stress: Not on file  Social Connections: Not on file  Intimate Partner Violence: Not on file     Physical Exam   Vitals:   10/13/21 0630 10/13/21 0645  BP: (!) 145/132 (!) 169/115  Pulse: (!) 106 (!) 104  Resp: (!) 23 (!) 24  SpO2: 95% 98%    CONSTITUTIONAL: Ill-appearing, in severe respiratory distress NEURO/PSYCH:  Alert and oriented x 3, no focal deficits EYES:   eyes equal and reactive ENT/NECK:  no LAD, no JVD CARDIO: Tachycardic rate, well-perfused, normal S1 and S2 PULM: Diffuse rhonchi, tachypneic, accessory muscle use GI/GU:  non-distended, non-tender MSK/SPINE:  No gross deformities, no edema SKIN:  no rash, atraumatic   *Additional and/or pertinent findings included in MDM below  Diagnostic and Interventional Summary    EKG Interpretation  Date/Time:  Thursday Oct 13 2021 06:12:30 EDT Ventricular Rate:  120 PR Interval:  83 QRS Duration: 141 QT Interval:  369 QTC Calculation: 522 R Axis:   252 Text Interpretation: Sinus tachycardia Left bundle branch block Nonspecific IVCD with LAD Inferior infarct, acute Anterior infarct, acute (LAD) Lateral leads are also involved Confirmed by Gerlene Fee [phone]) on 10/13/2021 6:16:54 AM       Labs Reviewed  CBC - Abnormal; Notable for the following components:      Result Value   WBC 12.6 (*)    RBC 5.43 (*)    Hemoglobin 17.2 (*)    HCT 53.4 (*)    All other components within normal limits  COMPREHENSIVE METABOLIC PANEL  BRAIN NATRIURETIC PEPTIDE  TROPONIN I (HIGH SENSITIVITY)    DG Chest Port 1 View  Final Result    CT Angio Chest Pulmonary Embolism (PE)  W or WO Contrast    (Results Pending)    Medications  nitroGLYCERIN 50 mg in dextrose 5 % 250 mL (0.2 mg/mL) infusion (10 mcg/min Intravenous New Bag/Given 10/13/21 0615)  cefTRIAXone (ROCEPHIN) 2 g in sodium chloride 0.9 % 100 mL IVPB (has no administration in time range)  azithromycin (ZITHROMAX) 500 mg in sodium chloride 0.9 % 250 mL IVPB (has no administration in time range)     Procedures  /  Critical Care .Critical Care Performed by: Maudie Flakes, MD Authorized by: Maudie Flakes, MD   Critical care provider statement:    Critical care time (minutes):  45   Critical care was necessary to treat or prevent imminent or life-threatening deterioration of the following conditions:  Respiratory failure   Critical care  was time spent personally by me on the following activities:  Development of treatment plan with patient or surrogate, discussions with consultants, evaluation of patient's response to treatment, examination of patient, ordering and review of laboratory studies, ordering and review of radiographic studies, ordering and performing treatments and interventions, pulse oximetry, re-evaluation of patient's condition and review of old charts  ED Course and Medical Decision Making  Initial Impression and Ddx Clinical impression is flash pulmonary edema, unclear cause as patient has no history of cardiac problems.  Suspect new onset heart failure.  Patient arrives with diffuse rhonchi, hypoxic respiratory failure, severely hypertensive.  Denies any chest pain.  EKG demonstrating a left bundle branch block, sinus tachycardia, possibly some signs of ischemia but not meeting STEMI criteria.  Initiating BiPAP, nitro drip.  Past medical/surgical history that increases complexity of ED encounter: None  Interpretation of Diagnostics I personally reviewed the EKG and my interpretation is as follows: Sinus tachycardia, left bundle branch block, discordant ST changes    Labs pending.  Chest x-ray interpreted as pneumonia rather than CHF.  Patient Reassessment and Ultimate Disposition/Management Awaiting labs, CTA chest to evaluate for PE, edema, pneumonia.  Will need admission, signed out to oncoming provider.  Patient management required discussion with the following services or consulting groups:  None  Complexity of Problems Addressed Acute illness or injury that poses threat of life of bodily function  Additional Data Reviewed and Analyzed Further history obtained from: EMS on arrival  Additional Factors Impacting ED Encounter Risk Consideration of hospitalization  [name], [place] [email or web address]  Final Clinical Impressions(s) / ED  Diagnoses     ICD-10-CM   1. Acute respiratory failure with hypoxia (HCC)  J96.01       ED Discharge Orders     None        Discharge Instructions Discussed with and Provided to Patient:   Discharge Instructions   None      Maudie Flakes, MD 10/13/21 [phone]

## 2021-10-13 NOTE — ED Notes (Signed)
Phlebotomist is coming to draw labs.

## 2021-10-13 NOTE — ED Provider Notes (Signed)
I received the patient in signout from Dr. Maurie Boettcher.  Briefly the patient is a 71 year old female with a chief complaints of acute onset shortness of breath.  This occurred this morning.  Patient presented most typically with acute pulmonary edema found to be very tachypneic hypoxic and hypertensive.  Improved with BiPAP and nitro drip.  Patient has been taken off of BiPAP and is now on 3 L of oxygen.  She tells me that she feels much better.  Initial chest x-ray independently interpreted by me as acute fluid overload but read by radiology as possible multifocal pneumonia.  Plan for CT to better visualize.  Will discuss with medicine for admission.     Deno Etienne, DO 10/13/21 [phone]

## 2021-10-13 NOTE — ED Notes (Signed)
Patient to ER room 34. Patient is diaphoretic, tachypneic, tachycardic, and very restless. Patient cannot sit still and continuously attempts to yell "I cant breath." Physician and multiple Rns at patient bedside. Respiratory places patient on bipap and within 2-3 minutes patient reports she feels much better and is resting. Call bell in reach. Patient remains on bedside monitor.

## 2021-10-13 NOTE — ED Notes (Signed)
Spoke to Dr.Steen on the phone with plan to dc nitro drip and watch bp. Notify MD when bp goes over 967 systolic. Pt is alert and oriented x 4. Not in any distress. Down to 2LPM [place] with O2 at 96-98% on the monitor. Continues to endorse some mild shortness of breath at rest. Will continue to monitor.

## 2021-10-13 NOTE — H&P (View-Only) (Signed)
Cardiology Consultation:   Patient ID: Theresa Hancock MRN: [id number]; DOB: [birth year]  Admit date: 10/13/2021 Date of Consult: 10/13/2021  PCP:  Merryl Hacker, No   [facility] Providers Cardiologist:  None      Patient Profile:   Theresa Hancock is a 71 y.o. female with a hx of breast cancer, who does not regularly follow with a PCP, who is being seen 10/13/2021 for the evaluation of new CHF, elevated troponin at the request of Dr. Cain Sieve.  History of Present Illness:   Theresa Hancock is a 71 year old female with above medical history. Per chart review, patient does not regularly see a PCP or a cardiologist. Does not have any known cardiac history.   Patient presented to the ED on 5/18 complaining of worsening OSOB with chest tightness. Patient was hypoxic with O2 saturatoin 87% on arrival, complained multiple times that she "couldn't breath" and was started on bi-pap. Later transitioned to [facility]. Admitted to the hosptialist service. Labs in the ED showed Na 143, K 4.3, creatinine 0.64, WBC 12.6, hemoglobin 17.2, platelets 293. HsTn 10>>104>>258. BNP normal at 54.  EKG showed sinus tachycardia, HR 120, LBBB.  CTA chest negative for PE, possible multifocal PNA vs edema.  CXR concerning for acute bronchitis and developing multilobar bilateral bronchopneumonia.  On interview, patient reports that she woke up this morning and felt like she could not breath. Also had substernal chest tightness. Denied any outright chest pain, but did notice the tightness. Denies having a cough. Denies any fever, chills, n/v/d, ankle swelling. Denies noticing any SOB on exertion or chest pain on exertion recently. Denies any orthopnea. Denies any past cardiac history or history of breathing issues. Does not see a doctor regularly .   Past Medical History:  Diagnosis Date   Breast cancer [facility])     Past Surgical History:  Procedure Laterality Date   BILATERAL TOTAL MASTECTOMY WITH AXILLARY LYMPH NODE DISSECTION  Bilateral 1999   LAPAROSCOPIC TOTAL HYSTERECTOMY Bilateral 1999     Home Medications:  Prior to Admission medications   Medication Sig Start Date End Date Taking? Authorizing Provider  diphenhydrAMINE (SOMINEX) 25 MG tablet Take 50 mg by mouth at bedtime as needed for sleep.   Yes [provider]    Inpatient Medications: Scheduled Meds:  amLODipine  5 mg Oral Daily   irbesartan  150 mg Oral Daily   rivaroxaban  10 mg Oral Daily   Continuous Infusions:  azithromycin Stopped (10/13/21 6789)   nitroGLYCERIN Stopped (10/13/21 1303)   PRN Meds:   Allergies:   No Known Allergies  Social History:   Social History   Socioeconomic History   Marital status: Single    Spouse name: Not on file   Number of children: Not on file   Years of education: Not on file   Highest education level: Not on file  Occupational History   Not on file  Tobacco Use   Smoking status: Never   Smokeless tobacco: Never  Substance and Sexual Activity   Alcohol use: Yes    Alcohol/week: 7.0 standard drinks    Types: 7 Glasses of wine per week   Drug use: Never   Sexual activity: Not Currently  Other Topics Concern   Not on file  Social History Narrative   Not on file   Social Determinants of Health   Financial Resource Strain: Not on file  Food Insecurity: Not on file  Transportation Needs: Not on file  Physical Activity: Not on  file  Stress: Not on file  Social Connections: Not on file  Intimate Partner Violence: Not on file    Family History:    Family History  Problem Relation Age of Onset   Breast cancer Mother    Heart attack Father    Heart failure Brother      ROS:  Please see the history of present illness.   All other ROS reviewed and negative.     Physical Exam/Data:   Vitals:   10/13/21 1245 10/13/21 1300 10/13/21 1315 10/13/21 1316  BP: 130/85 125/70 134/69   Pulse: 89 88 87   Resp: 17 (!) 22 20   Temp:    98 F (36.7 C)  TempSrc:    Oral  SpO2:  97% 98% 98%     Intake/Output Summary (Last 24 hours) at 10/13/2021 1437 Last data filed at 10/13/2021 1303 Gross per 24 hour  Intake 405.23 ml  Output --  Net 405.23 ml       View : No data to display.           There is no height or weight on file to calculate BMI.  General:  Well nourished, well developed, in no acute distress. Sitting upright in the chair wearing nasal cannula with 3 L /min supplemental oxygen  HEENT: normal Neck: no JVD Vascular: Radial pulses 2+ bilaterally Cardiac:  normal S1, S2; RRR; no murmur  Lungs:  clear to auscultation bilaterally, no wheezing, rhonchi or rales  Abd: soft, nontender, no hepatomegaly  Ext: trace nonpitting edema Musculoskeletal:  No deformities, BUE and BLE strength normal and equal Skin: warm and dry  Neuro:  CNs 2-12 intact, no focal abnormalities noted Psych:  Normal affect   EKG:  The EKG was personally reviewed and demonstrates:  Sinus rhythm, LBBB Telemetry:  Telemetry was personally reviewed and demonstrates:    Relevant CV Studies:  Echocardiogram 10/13/21  1 . Global hypokinesis abnormal septal motion . Left ventricular ejection  fraction, by estimation, is 40 to 45%. The left ventricle has mildly  decreased function. The left ventricle demonstrates global hypokinesis.  The left ventricular internal cavity  size was mildly dilated. Left ventricular diastolic parameters are  consistent with Grade I diastolic dysfunction (impaired relaxation).   2. Right ventricular systolic function is normal. The right ventricular  size is normal.   3. Left atrial size was mildly dilated.   4. The mitral valve is abnormal. Mild mitral valve regurgitation. No  evidence of mitral stenosis.   5. The aortic valve is tricuspid. Aortic valve regurgitation is not  visualized. No aortic stenosis is present.   6. The inferior vena cava is dilated in size with >50% respiratory  variability, suggesting right atrial pressure of 8 mmHg.    Laboratory Data:  High Sensitivity Troponin:   Recent Labs  Lab 10/13/21 0627 10/13/21 0832 10/13/21 1159  TROPONINIHS 10 104* 258*     Chemistry Recent Labs  Lab 10/13/21 0627  NA 143  K 4.3  CL 110  CO2 22  GLUCOSE 174*  BUN 16  CREATININE 0.64  CALCIUM 9.0  GFRNONAA >60  ANIONGAP 11    Recent Labs  Lab 10/13/21 0627  PROT 7.3  ALBUMIN 4.3  AST 78*  ALT 59*  ALKPHOS 81  BILITOT 0.5   Lipids  Recent Labs  Lab 10/13/21 1159  CHOL 216*  TRIG 77  HDL 79  LDLCALC 122*  CHOLHDL 2.7    Hematology Recent Labs  Lab 10/13/21  0627  WBC 12.6*  RBC 5.43*  HGB 17.2*  HCT 53.4*  MCV 98.3  MCH 31.7  MCHC 32.2  RDW 13.2  PLT 293   Thyroid  Recent Labs  Lab 10/13/21 1159  TSH 0.852    BNP Recent Labs  Lab 10/13/21 0627  BNP 54.0    DDimer No results for input(s): DDIMER in the last 168 hours.   Radiology/Studies:  CT Angio Chest Pulmonary Embolism (PE) W or WO Contrast  Result Date: 10/13/2021 CLINICAL DATA:  Shortness of breath. EXAM: CT ANGIOGRAPHY CHEST WITH CONTRAST TECHNIQUE: Multidetector CT imaging of the chest was performed using the standard protocol during bolus administration of intravenous contrast. Multiplanar CT image reconstructions and MIPs were obtained to evaluate the vascular anatomy. RADIATION DOSE REDUCTION: This exam was performed according to the departmental dose-optimization program which includes automated exposure control, adjustment of the mA and/or kV according to patient size and/or use of iterative reconstruction technique. CONTRAST:  21m OMNIPAQUE IOHEXOL 350 MG/ML SOLN COMPARISON:  None Available. FINDINGS: Cardiovascular: Satisfactory opacification of the pulmonary arteries to the segmental level. No evidence of pulmonary embolism. Normal heart size. No pericardial effusion. Mediastinum/Nodes: No enlarged mediastinal, hilar, or axillary lymph nodes. Thyroid gland, trachea, and esophagus demonstrate no significant  findings. Lungs/Pleura: Minimal bilateral pleural effusions are noted. No pneumothorax is noted. Bibasilar opacities are noted concerning for edema or possibly multifocal pneumonia. Upper Abdomen: No acute abnormality. Musculoskeletal: No chest wall abnormality. No acute or significant osseous findings. Review of the MIP images confirms the above findings. IMPRESSION: No definite evidence of pulmonary embolus. Bibasilar opacities are noted concerning for edema or possibly multifocal pneumonia. Small pleural effusions are noted. Electronically Signed   By: JMarijo ConceptionM.D.   On: 10/13/2021 08:20   DG Chest Port 1 View  Result Date: 10/13/2021 CLINICAL DATA:  71year old female with history of shortness of breath. EXAM: PORTABLE CHEST 1 VIEW COMPARISON:  No priors. FINDINGS: Lung volumes are normal. Widespread areas of interstitial prominence, diffuse peribronchial cuffing and ill-defined opacities are noted throughout the lungs bilaterally, most severe throughout the mid to lower lungs. No pleural effusions. No pneumothorax. Pulmonary vasculature does not appearing origin. Heart size is upper limits of normal. Upper mediastinal contours are within normal limits. Atherosclerotic calcifications in the thoracic aorta. IMPRESSION: 1. The appearance the chest is concerning for acute bronchitis and developing multilobar bilateral bronchopneumonia, as above. 2. Aortic atherosclerosis. Electronically Signed   By: DVinnie LangtonM.D.   On: 10/13/2021 06:18   ECHOCARDIOGRAM COMPLETE  Result Date: 10/13/2021    ECHOCARDIOGRAM REPORT   Patient Name:   Theresa SANJOSEDate of Exam: 10/13/2021 Medical Rec #:  [id number]      Height:       62.0 in Accession #:    [id number]     Weight:       185.0 lb Date of Birth:  [birth year]     BSA:          1.849 m Patient Age:    89years        BP:           136/77 mmHg Patient Gender: F               HR:           87 bpm. Exam Location:  Inpatient Procedure: 2D Echo, Color  Doppler and Cardiac Doppler Indications:    R06.9 DOE  History:  Patient has no prior history of Echocardiogram examinations.  Sonographer:    Raquel Sarna Senior RDCS Referring Phys: [id number] JULIE MACHEN  Sonographer Comments: Poor parasternal window due to scar tissue interference IMPRESSIONS  1. Global hypokinesis abnormal septal motion . Left ventricular ejection fraction, by estimation, is 40 to 45%. The left ventricle has mildly decreased function. The left ventricle demonstrates global hypokinesis. The left ventricular internal cavity size was mildly dilated. Left ventricular diastolic parameters are consistent with Grade I diastolic dysfunction (impaired relaxation).  2. Right ventricular systolic function is normal. The right ventricular size is normal.  3. Left atrial size was mildly dilated.  4. The mitral valve is abnormal. Mild mitral valve regurgitation. No evidence of mitral stenosis.  5. The aortic valve is tricuspid. Aortic valve regurgitation is not visualized. No aortic stenosis is present.  6. The inferior vena cava is dilated in size with >50% respiratory variability, suggesting right atrial pressure of 8 mmHg. FINDINGS  Left Ventricle: Global hypokinesis abnormal septal motion. Left ventricular ejection fraction, by estimation, is 40 to 45%. The left ventricle has mildly decreased function. The left ventricle demonstrates global hypokinesis. The left ventricular internal cavity size was mildly dilated. There is no left ventricular hypertrophy. Left ventricular diastolic parameters are consistent with Grade I diastolic dysfunction (impaired relaxation). Right Ventricle: The right ventricular size is normal. No increase in right ventricular wall thickness. Right ventricular systolic function is normal. Left Atrium: Left atrial size was mildly dilated. Right Atrium: Right atrial size was normal in size. Pericardium: There is no evidence of pericardial effusion. Mitral Valve: The mitral valve is  abnormal. There is mild thickening of the mitral valve leaflet(s). Mild mitral valve regurgitation. No evidence of mitral valve stenosis. Tricuspid Valve: The tricuspid valve is normal in structure. Tricuspid valve regurgitation is not demonstrated. No evidence of tricuspid stenosis. Aortic Valve: The aortic valve is tricuspid. Aortic valve regurgitation is not visualized. No aortic stenosis is present. Pulmonic Valve: The pulmonic valve was normal in structure. Pulmonic valve regurgitation is not visualized. No evidence of pulmonic stenosis. Aorta: The aortic root is normal in size and structure. Venous: The inferior vena cava is dilated in size with greater than 50% respiratory variability, suggesting right atrial pressure of 8 mmHg. IAS/Shunts: No atrial level shunt detected by color flow Doppler.  LEFT VENTRICLE PLAX 2D LVIDd:         4.40 cm   Diastology LVIDs:         3.00 cm   LV e' medial:  5.98 cm/s LV PW:         0.90 cm   LV e' lateral: 7.94 cm/s LV IVS:        0.90 cm LVOT diam:     2.00 cm LV SV:         60 LV SV Index:   32 LVOT Area:     3.14 cm  RIGHT VENTRICLE RV S prime:     12.20 cm/s TAPSE (M-mode): 2.4 cm LEFT ATRIUM             Index        RIGHT ATRIUM           Index LA diam:        4.00 cm 2.16 cm/m   RA Area:     13.80 cm LA Vol (A2C):   43.0 ml 23.25 ml/m  RA Volume:   32.80 ml  17.74 ml/m LA Vol (A4C):   46.3 ml 25.04 ml/m LA  Biplane Vol: 47.4 ml 25.63 ml/m  AORTIC VALVE LVOT Vmax:   101.00 cm/s LVOT Vmean:  69.100 cm/s LVOT VTI:    0.190 m  AORTA Ao Root diam: 2.70 cm Ao Asc diam:  3.10 cm  SHUNTS Systemic VTI:  0.19 m Systemic Diam: 2.00 cm Jenkins Rouge MD Electronically signed by Jenkins Rouge MD Signature Date/Time: 10/13/2021/11:46:52 AM    Final      Assessment and Plan:   Elevated Troponin  - hsTn 10>>104>>258  - Patient reports acute onset of SOB and chest tightness this AM  - Patient noted to have high BP on arrival, possible this is demand ischemia/troponin leak due  to htn urgency. However I am concerned that her trops are trending upward and patient does have uncontrolled risk factors (HTN, HLD, possible DM). Also concerned about acute onset of symptoms this AM, newly reduced EF on echo with RWMA - EKG with LBBB, difficult to evaluate for ischemic changes  - Start IV heparin, daily ASA, statin  - Plan for R/L heart cath tomorrow   HTN urgency  - Initial BP 232/140 - Patient started on amlodipine, irbesartan, IV nitro  - BP improving, most recently 145/80  Acute HFrEF  - Echo this admission showed global hypokinesis with abnormal septal motion, LVEF 51-76%, grade I diastolic dysfunction - Unknown etiology-- possibly secondary to uncontrolled HTN ? Brother also had history of HF (thought to be genetic)  - Continue ARB  - Given lasix '40mg'$  IV x1 dose, appears overall euvolemic on exam  - Needs ischemic eval. Given elevated/upward trending troponin, Plan for L/R heart cath this admission   HLD  - LDL 122, HDL 79, triglycerides 77  - Start lipitor 80 mg daily. Needs LFTs and lipid panel in 8 weeks    Risk Assessment/Risk Scores:    New York Heart Association (NYHA) Functional Class NYHA Class II    For questions or updates, please contact [facility] Please consult [email or web address] for contact info under    Signed, Margie Billet, PA-C  10/13/2021 2:37 PM   Patient seen and examined.  Agree with below documentation.  Theresa Hancock is a 71 year old female with a history of breast cancer who we are consulted for evaluation of heart failure and troponin elevation at the request of Dr. Cain Sieve.  She does not have regular medical follow-up.  She presented to the ED today with complaints of chest pain.  Reports she woke up this morning and felt short of breath and had chest pain that she describes as tightness in center of chest.  In the ED, initial vital signs notable for BP 232/140, pulse 134, SPO2 88% on room air.  She was started on BiPAP and  weaned to nasal cannula.  EKG showed sinus tachycardia, left bundle branch block.  Labs notable for creatinine 0.6, WBC 12.6, hemoglobin 17.2, platelets 293, BNP 54, troponin 10 > 104 > 258 > 296, LDL 122.  CTPA showed no PE, small pleural effusions, bibasilar opacities concerning for edema or possible multifocal pneumonia.  Echocardiogram showed EF 40 to 45%, normal RV function, mild MR.  On exam, patient is alert and oriented, regular rate and rhythm, no murmurs, lungs CTAB, no LE edema or JVD.  Suspect her troponin elevation represents demand ischemia in setting of her severely elevated blood pressure on admission.  However given her chest pain and also with new systolic dysfunction on echocardiogram, warrants ischemic evaluation.  Recommend RHC/LHC tomorrow.  She was started on amlodipine and irbesartan  for BP control.  We will switch amlodipine to carvedilol 6.25 mg twice daily given her systolic dysfunction.  Risks and benefits of cardiac catheterization have been discussed with the patient.  These include bleeding, infection, kidney damage, stroke, heart attack, death.  The patient understands these risks and is willing to proceed.   Donato Heinz, MD

## 2021-10-14 ENCOUNTER — Encounter (HOSPITAL_COMMUNITY)
Admission: EM | Disposition: A | Discharge status: Home / Self Care | Payer: Self-pay | Source: Home / Self Care | Attending: Internal Medicine

## 2021-10-14 ENCOUNTER — Other Ambulatory Visit (HOSPITAL_COMMUNITY): Payer: Self-pay

## 2021-10-14 DIAGNOSIS — Z8249 Family history of ischemic heart disease and other diseases of the circulatory system: Secondary | ICD-10-CM | POA: Diagnosis not present

## 2021-10-14 DIAGNOSIS — I110 Hypertensive heart disease with heart failure: Secondary | ICD-10-CM | POA: Diagnosis present

## 2021-10-14 DIAGNOSIS — Z66 Do not resuscitate: Secondary | ICD-10-CM | POA: Diagnosis present

## 2021-10-14 DIAGNOSIS — J9601 Acute respiratory failure with hypoxia: Secondary | ICD-10-CM | POA: Diagnosis present

## 2021-10-14 DIAGNOSIS — T501X5A Adverse effect of loop [high-ceiling] diuretics, initial encounter: Secondary | ICD-10-CM | POA: Diagnosis not present

## 2021-10-14 DIAGNOSIS — I4721 Torsades de pointes: Secondary | ICD-10-CM | POA: Diagnosis not present

## 2021-10-14 DIAGNOSIS — I161 Hypertensive emergency: Secondary | ICD-10-CM | POA: Diagnosis present

## 2021-10-14 DIAGNOSIS — Z9013 Acquired absence of bilateral breasts and nipples: Secondary | ICD-10-CM | POA: Diagnosis not present

## 2021-10-14 DIAGNOSIS — I5043 Acute on chronic combined systolic (congestive) and diastolic (congestive) heart failure: Secondary | ICD-10-CM | POA: Diagnosis present

## 2021-10-14 DIAGNOSIS — Z853 Personal history of malignant neoplasm of breast: Secondary | ICD-10-CM | POA: Diagnosis not present

## 2021-10-14 DIAGNOSIS — I160 Hypertensive urgency: Secondary | ICD-10-CM | POA: Diagnosis present

## 2021-10-14 DIAGNOSIS — E876 Hypokalemia: Secondary | ICD-10-CM | POA: Diagnosis not present

## 2021-10-14 DIAGNOSIS — I5041 Acute combined systolic (congestive) and diastolic (congestive) heart failure: Secondary | ICD-10-CM

## 2021-10-14 DIAGNOSIS — E785 Hyperlipidemia, unspecified: Secondary | ICD-10-CM | POA: Diagnosis present

## 2021-10-14 DIAGNOSIS — I340 Nonrheumatic mitral (valve) insufficiency: Secondary | ICD-10-CM | POA: Diagnosis present

## 2021-10-14 DIAGNOSIS — D72829 Elevated white blood cell count, unspecified: Secondary | ICD-10-CM | POA: Diagnosis not present

## 2021-10-14 DIAGNOSIS — I2489 Other forms of acute ischemic heart disease: Secondary | ICD-10-CM

## 2021-10-14 DIAGNOSIS — I248 Other forms of acute ischemic heart disease: Secondary | ICD-10-CM

## 2021-10-14 DIAGNOSIS — I420 Dilated cardiomyopathy: Secondary | ICD-10-CM | POA: Diagnosis present

## 2021-10-14 DIAGNOSIS — Z803 Family history of malignant neoplasm of breast: Secondary | ICD-10-CM | POA: Diagnosis not present

## 2021-10-14 DIAGNOSIS — I447 Left bundle-branch block, unspecified: Secondary | ICD-10-CM | POA: Diagnosis present

## 2021-10-14 DIAGNOSIS — I21A1 Myocardial infarction type 2: Secondary | ICD-10-CM | POA: Diagnosis present

## 2021-10-14 HISTORY — PX: RIGHT/LEFT HEART CATH AND CORONARY ANGIOGRAPHY: CATH118266

## 2021-10-14 LAB — POCT I-STAT EG7
Acid-Base Excess: 2 mmol/L (ref 0.0–2.0)
Acid-Base Excess: 2 mmol/L (ref 0.0–2.0)
Bicarbonate: 26.5 mmol/L (ref 20.0–28.0)
Bicarbonate: 26.7 mmol/L (ref 20.0–28.0)
Calcium, Ion: 1.09 mmol/L — ABNORMAL LOW (ref 1.15–1.40)
Calcium, Ion: 1.1 mmol/L — ABNORMAL LOW (ref 1.15–1.40)
HCT: 45 % (ref 36.0–46.0)
HCT: 45 % (ref 36.0–46.0)
Hemoglobin: 15.3 g/dL — ABNORMAL HIGH (ref 12.0–15.0)
Hemoglobin: 15.3 g/dL — ABNORMAL HIGH (ref 12.0–15.0)
O2 Saturation: 76 %
O2 Saturation: 77 %
Potassium: 3.2 mmol/L — ABNORMAL LOW (ref 3.5–5.1)
Potassium: 3.2 mmol/L — ABNORMAL LOW (ref 3.5–5.1)
Sodium: 145 mmol/L (ref 135–145)
Sodium: 145 mmol/L (ref 135–145)
TCO2: 28 mmol/L (ref 22–32)
TCO2: 28 mmol/L (ref 22–32)
pCO2, Ven: 40 mmHg — ABNORMAL LOW (ref 44–60)
pCO2, Ven: 40.4 mmHg — ABNORMAL LOW (ref 44–60)
pH, Ven: 7.429 (ref 7.25–7.43)
pH, Ven: 7.429 (ref 7.25–7.43)
pO2, Ven: 40 mmHg (ref 32–45)
pO2, Ven: 40 mmHg (ref 32–45)

## 2021-10-14 LAB — COMPREHENSIVE METABOLIC PANEL
ALT: 62 U/L — ABNORMAL HIGH (ref 0–44)
AST: 38 U/L (ref 15–41)
Albumin: 3.8 g/dL (ref 3.5–5.0)
Alkaline Phosphatase: 80 U/L (ref 38–126)
Anion gap: 11 (ref 5–15)
BUN: 16 mg/dL (ref 8–23)
CO2: 25 mmol/L (ref 22–32)
Calcium: 8.8 mg/dL — ABNORMAL LOW (ref 8.9–10.3)
Chloride: 107 mmol/L (ref 98–111)
Creatinine, Ser: 0.48 mg/dL (ref 0.44–1.00)
GFR, Estimated: >60 mL/min (ref >60)
Glucose, Bld: 143 mg/dL — ABNORMAL HIGH (ref 70–99)
Potassium: 3.3 mmol/L — ABNORMAL LOW (ref 3.5–5.1)
Sodium: 143 mmol/L (ref 135–145)
Total Bilirubin: 1 mg/dL (ref 0.3–1.2)
Total Protein: 6.8 g/dL (ref 6.5–8.1)

## 2021-10-14 LAB — POCT I-STAT 7, (LYTES, BLD GAS, ICA,H+H)
Acid-Base Excess: 1 mmol/L (ref 0.0–2.0)
Bicarbonate: 25.5 mmol/L (ref 20.0–28.0)
Calcium, Ion: 1.14 mmol/L — ABNORMAL LOW (ref 1.15–1.40)
HCT: 46 % (ref 36.0–46.0)
Hemoglobin: 15.6 g/dL — ABNORMAL HIGH (ref 12.0–15.0)
O2 Saturation: 95 %
Potassium: 3.3 mmol/L — ABNORMAL LOW (ref 3.5–5.1)
Sodium: 143 mmol/L (ref 135–145)
TCO2: 27 mmol/L (ref 22–32)
pCO2 arterial: 37.8 mmHg (ref 32–48)
pH, Arterial: 7.438 (ref 7.35–7.45)
pO2, Arterial: 72 mmHg — ABNORMAL LOW (ref 83–108)

## 2021-10-14 LAB — CBC WITH DIFFERENTIAL/PLATELET
Abs Immature Granulocytes: 0.05 10*3/uL (ref 0.00–0.07)
Basophils Absolute: 0.1 10*3/uL (ref 0.0–0.1)
Basophils Relative: 1 %
Eosinophils Absolute: 0.4 10*3/uL (ref 0.0–0.5)
Eosinophils Relative: 4 %
HCT: 47.9 % — ABNORMAL HIGH (ref 36.0–46.0)
Hemoglobin: 15.8 g/dL — ABNORMAL HIGH (ref 12.0–15.0)
Immature Granulocytes: 1 %
Lymphocytes Relative: 23 %
Lymphs Abs: 2.3 10*3/uL (ref 0.7–4.0)
MCH: 31.4 pg (ref 26.0–34.0)
MCHC: 33 g/dL (ref 30.0–36.0)
MCV: 95.2 fL (ref 80.0–100.0)
Monocytes Absolute: 0.6 10*3/uL (ref 0.1–1.0)
Monocytes Relative: 6 %
Neutro Abs: 6.8 10*3/uL (ref 1.7–7.7)
Neutrophils Relative %: 65 %
Platelets: 268 10*3/uL (ref 150–400)
RBC: 5.03 MIL/uL (ref 3.87–5.11)
RDW: 13.1 % (ref 11.5–15.5)
WBC: 10.3 10*3/uL (ref 4.0–10.5)
nRBC: 0 % (ref 0.0–0.2)

## 2021-10-14 LAB — APTT
aPTT: 31 seconds (ref 24–36)
aPTT: 76 seconds — ABNORMAL HIGH (ref 24–36)

## 2021-10-14 LAB — MAGNESIUM: Magnesium: 2.1 mg/dL (ref 1.7–2.4)

## 2021-10-14 LAB — HEPARIN LEVEL (UNFRACTIONATED): Heparin Unfractionated: 0.63 IU/mL (ref 0.30–0.70)

## 2021-10-14 SURGERY — RIGHT/LEFT HEART CATH AND CORONARY ANGIOGRAPHY
Anesthesia: LOCAL

## 2021-10-14 MED ORDER — SODIUM CHLORIDE 0.9% FLUSH: 3.0000 mL | INTRAVENOUS | Status: DC | PRN

## 2021-10-14 MED ORDER — IOHEXOL 350 MG/ML SOLN
INTRAVENOUS | Status: DC | PRN
  Administered 2021-10-14: 45 mL

## 2021-10-14 MED ORDER — SPIRONOLACTONE 25 MG PO TABS
25.0000 mg | ORAL_TABLET | Freq: Every day | ORAL | Status: DC
  Administered 2021-10-14 – 2021-10-16 (×3): 25 mg via ORAL
  Filled 2021-10-14 (×3): qty 1

## 2021-10-14 MED ORDER — SODIUM CHLORIDE 0.9 % IV SOLN
250.0000 mL | INTRAVENOUS | Status: DC | PRN
  Administered 2021-10-15: 250 mL via INTRAVENOUS

## 2021-10-14 MED ORDER — HEPARIN (PORCINE) IN NACL 1000-0.9 UT/500ML-% IV SOLN
INTRAVENOUS | Status: DC | PRN
  Administered 2021-10-14 (×2): 500 mL

## 2021-10-14 MED ORDER — VERAPAMIL HCL 2.5 MG/ML IV SOLN
INTRAVENOUS | Status: DC | PRN
  Administered 2021-10-14: 10 mL via INTRA_ARTERIAL

## 2021-10-14 MED ORDER — FENTANYL CITRATE (PF) 100 MCG/2ML IJ SOLN
INTRAMUSCULAR | Status: DC | PRN
Start: 2021-10-14 — End: 2021-10-14
  Administered 2021-10-14: 25 ug via INTRAVENOUS

## 2021-10-14 MED ORDER — ACETAMINOPHEN 325 MG PO TABS: 650.0000 mg | ORAL_TABLET | ORAL | Status: DC | PRN

## 2021-10-14 MED ORDER — HYDRALAZINE HCL 20 MG/ML IJ SOLN: 10.0000 mg | INTRAMUSCULAR | Status: AC | PRN

## 2021-10-14 MED ORDER — POTASSIUM CHLORIDE 20 MEQ PO PACK
40.0000 meq | PACK | Freq: Once | ORAL | Status: AC
Start: 2021-10-14 — End: 2021-10-14
  Administered 2021-10-14: 40 meq via ORAL
  Filled 2021-10-14 (×2): qty 2

## 2021-10-14 MED ORDER — ATORVASTATIN CALCIUM 40 MG PO TABS
40.0000 mg | ORAL_TABLET | Freq: Every day | ORAL | Status: DC
  Administered 2021-10-15 – 2021-10-16 (×2): 40 mg via ORAL
  Filled 2021-10-14 (×2): qty 1

## 2021-10-14 MED ORDER — DAPAGLIFLOZIN PROPANEDIOL 10 MG PO TABS
10.0000 mg | ORAL_TABLET | Freq: Every day | ORAL | Status: DC
  Administered 2021-10-14 – 2021-10-16 (×3): 10 mg via ORAL
  Filled 2021-10-14 (×3): qty 1

## 2021-10-14 MED ORDER — POTASSIUM CHLORIDE CRYS ER 20 MEQ PO TBCR
40.0000 meq | EXTENDED_RELEASE_TABLET | Freq: Once | ORAL | Status: AC
  Administered 2021-10-14: 40 meq via ORAL

## 2021-10-14 MED ORDER — HEPARIN (PORCINE) IN NACL 1000-0.9 UT/500ML-% IV SOLN
INTRAVENOUS | Status: AC
  Filled 2021-10-14: qty 1000

## 2021-10-14 MED ORDER — FUROSEMIDE 10 MG/ML IJ SOLN: 40.0000 mg | Freq: Two times a day (BID) | INTRAMUSCULAR | Status: DC

## 2021-10-14 MED ORDER — SODIUM CHLORIDE 0.9 % IV SOLN: INTRAVENOUS | Status: AC

## 2021-10-14 MED ORDER — FUROSEMIDE 10 MG/ML IJ SOLN
40.0000 mg | Freq: Every day | INTRAMUSCULAR | Status: DC
  Administered 2021-10-15: 40 mg via INTRAVENOUS
  Filled 2021-10-14: qty 4

## 2021-10-14 MED ORDER — ONDANSETRON HCL 4 MG/2ML IJ SOLN
4.0000 mg | Freq: Four times a day (QID) | INTRAMUSCULAR | Status: DC | PRN
  Filled 2021-10-14: qty 2

## 2021-10-14 MED ORDER — FUROSEMIDE 10 MG/ML IJ SOLN
40.0000 mg | Freq: Once | INTRAMUSCULAR | Status: AC
  Administered 2021-10-14: 40 mg via INTRAVENOUS
  Filled 2021-10-14: qty 4

## 2021-10-14 MED ORDER — LIDOCAINE HCL (PF) 1 % IJ SOLN
INTRAMUSCULAR | Status: AC
Start: 2021-10-14 — End: ?
  Filled 2021-10-14: qty 30

## 2021-10-14 MED ORDER — MIDAZOLAM HCL 2 MG/2ML IJ SOLN
INTRAMUSCULAR | Status: AC
  Filled 2021-10-14: qty 2

## 2021-10-14 MED ORDER — LABETALOL HCL 5 MG/ML IV SOLN: 10.0000 mg | INTRAVENOUS | Status: AC | PRN

## 2021-10-14 MED ORDER — MORPHINE SULFATE (PF) 2 MG/ML IV SOLN: 2.0000 mg | INTRAVENOUS | Status: DC | PRN

## 2021-10-14 MED ORDER — HEPARIN SODIUM (PORCINE) 5000 UNIT/ML IJ SOLN
5000.0000 [IU] | Freq: Three times a day (TID) | INTRAMUSCULAR | Status: DC
  Administered 2021-10-14 – 2021-10-16 (×5): 5000 [IU] via SUBCUTANEOUS
  Filled 2021-10-14 (×5): qty 1

## 2021-10-14 MED ORDER — SODIUM CHLORIDE 0.9% FLUSH
3.0000 mL | Freq: Two times a day (BID) | INTRAVENOUS | Status: DC
  Administered 2021-10-14 – 2021-10-16 (×5): 3 mL via INTRAVENOUS

## 2021-10-14 MED ORDER — VERAPAMIL HCL 2.5 MG/ML IV SOLN
INTRAVENOUS | Status: AC
  Filled 2021-10-14: qty 2

## 2021-10-14 MED ORDER — HEPARIN SODIUM (PORCINE) 1000 UNIT/ML IJ SOLN
INTRAMUSCULAR | Status: DC | PRN
  Administered 2021-10-14: 5000 [IU] via INTRAVENOUS

## 2021-10-14 MED ORDER — POTASSIUM CHLORIDE CRYS ER 20 MEQ PO TBCR
EXTENDED_RELEASE_TABLET | ORAL | Status: AC
  Filled 2021-10-14: qty 2

## 2021-10-14 MED ORDER — FENTANYL CITRATE (PF) 100 MCG/2ML IJ SOLN
INTRAMUSCULAR | Status: AC
  Filled 2021-10-14: qty 2

## 2021-10-14 MED ORDER — LIDOCAINE HCL (PF) 1 % IJ SOLN
INTRAMUSCULAR | Status: DC | PRN
  Administered 2021-10-14: 5 mL

## 2021-10-14 MED ORDER — HEPARIN SODIUM (PORCINE) 1000 UNIT/ML IJ SOLN
INTRAMUSCULAR | Status: AC
  Filled 2021-10-14: qty 10

## 2021-10-14 MED ORDER — MIDAZOLAM HCL 2 MG/2ML IJ SOLN
INTRAMUSCULAR | Status: DC | PRN
  Administered 2021-10-14: 1 mg via INTRAVENOUS

## 2021-10-14 SURGICAL SUPPLY — 16 items
CATH BALLN WEDGE 5F 110CM (CATHETERS) ×1 IMPLANT
CATH INFINITI 5FR ANG PIGTAIL (CATHETERS) ×1 IMPLANT
CATH OPTITORQUE TIG 4.0 5F (CATHETERS) ×1 IMPLANT
DEVICE RAD COMP TR BAND LRG (VASCULAR PRODUCTS) ×1 IMPLANT
ELECT DEFIB PAD ADLT CADENCE (PAD) ×1 IMPLANT
GLIDESHEATH SLEND SS 6F .021 (SHEATH) ×1 IMPLANT
GUIDEWIRE .025 260CM (WIRE) ×1 IMPLANT
GUIDEWIRE INQWIRE 1.5J.035X260 (WIRE) IMPLANT
INQWIRE 1.5J .035X260CM (WIRE) ×2
KIT HEART LEFT (KITS) ×2 IMPLANT
PACK CARDIAC CATHETERIZATION (CUSTOM PROCEDURE TRAY) ×2 IMPLANT
SHEATH GLIDE SLENDER 4/5FR (SHEATH) ×1 IMPLANT
SHEATH PINNACLE 5F 10CM (SHEATH) ×1 IMPLANT
SHEATH PROBE COVER 6X72 (BAG) ×1 IMPLANT
TRANSDUCER W/STOPCOCK (MISCELLANEOUS) ×2 IMPLANT
TUBING CIL FLEX 10 FLL-RA (TUBING) ×2 IMPLANT

## 2021-10-14 NOTE — Progress Notes (Signed)
ANTICOAGULATION CONSULT NOTE  Pharmacy Consult for Heparin Indication: chest pain/ACS  No Known Allergies  Patient Measurements: Height: '5\' 2"'$  (157.5 cm) Weight: 92.3 kg (203 lb 7.8 oz) IBW/kg (Calculated) : 50.1 Heparin Dosing Weight: 71.5 kg  Vital Signs: Temp: 98.4 F (36.9 C) (05/18 2301) Temp Source: Oral (05/18 2301) BP: 165/84 (05/18 2351) Pulse Rate: 90 (05/18 2301)  Labs: Recent Labs    10/13/21 0627 10/13/21 0832 10/13/21 1633 10/13/21 1931 10/13/21 2236  HGB 17.2*  --   --   --   --   HCT 53.4*  --   --   --   --   PLT 293  --   --   --   --   APTT  --   --   --   --  31  HEPARINUNFRC  --   --   --   --  1.09*  CREATININE 0.64  --   --   --   --   TROPONINIHS 10   < > 296* 335* 297*   < > = values in this interval not displayed.    Estimated Creatinine Clearance: 71.2 mL/min (by C-G formula based on SCr of 0.64 mg/dL).   Assessment: 71 yo female presented 10/13/2021 for SOB. Pharmacy consulted to dose heparin for ACS. Plan for cath tomorrow. Patient started on rivaroxaban '10mg'$  daily for DVT prophylaxis - last dose 5/19 AM. No anticoagulation prior to admission.   Heparin level remains falsely elevated due to recent rivaroxaban dose. aPTT subtherapeutic at 31. Previous CBC stable, but per discussion with RN, blood-tinged urine was reported by the patient this evening. No other signs/symptoms of bleeding. Will continue to monitor for continuing/worsening blood in urine.  Goal of Therapy:  Heparin level 0.3-0.7 units/ml aPTT 66-102 seconds Monitor platelets by anticoagulation protocol: Yes   Plan:  Increase heparin infusion to 1000 units/hr Check heparin level in 6 hours and daily while on heparin Continue to monitor H&H and platelets    Thank you for allowing pharmacy to be a part of this patient's care.  Ardyth Harps, PharmD Clinical Pharmacist

## 2021-10-14 NOTE — Progress Notes (Addendum)
Subjective:  No acute events overnight. Patient has been NPO since midnight for right and left heart catheterization this morning. States still feeling groggy from the anesthesia. Reports improvement in her shortness of breath and chest tightness. Denies orthopnea, PND, or lower extremity swelling.   Objective:  Vital signs in last 24 hours: Vitals:   10/14/21 0821 10/14/21 0826 10/14/21 0831 10/14/21 0836  BP: (!) 170/87 (!) 179/100 (!) 167/85 (!) 168/87  Pulse: 86 93 97 92  Resp: '15 17 16 '$ (!) 24  Temp:      TempSrc:      SpO2: 95% 93% 93% 94%  Weight:      Height:       Weight change:   Intake/Output Summary (Last 24 hours) at 10/14/2021 0840 Last data filed at 10/14/2021 0148 Gross per 24 hour  Intake 386.76 ml  Output 2350 ml  Net -1963.24 ml   General: Well appearing and in no acute distress Neuro: A&O x3, normal affect Cardiovascular: RRR, no m/r/g. Normal S1 and S2 without S3 or S4. Pulses 2+ in bilateral UE and LE. Trace peripheral edema to mid calves bilaterally.  Pulmonary: CTAB, no wheezes, rhonchi or rales. Normal WOB.  Abdominal: Abdomen soft and non-distended. Normal bowel sounds in all four quadrants. No tenderness to palpation, guarding, or rebound tenderness Skin: Warm and dry with no rashes, cuts, or bruises MSK: Normal ROM of all extremities.    Assessment/Plan:  Principal Problem:   Hypertensive urgency Active Problems:   Acute systolic heart failure (HCC)   Elevated troponin  71 year old female with a history of breast cancer in 1970s s/p chemotherapy and bilateral mastectomy presented with acute onset dyspnea, now found to have acute HFmrEF.   Acute HFmrEF with diastolic dysfunction  Nonischemic dilated cardiomyopathy  Patient with no prior cardiac history presented with acute onset shortness of breath. CXR with bilateral opacities in mid-lower lungs. BNP 54. Echo with LVEF 40-45%, mild LV cavity dilation with global hypokinesis, and grade I  diastolic dysfunction. Left heart catheterization with angiographically normal coronary arteries. Overall, presentation most consistent with acute decompensated systolic and diastolic heart failure driven by nonischemic cardiomyopathy. 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 Denies recent illness or travel so less likely to be infection induced cardiomyopathy. Infiltrative conditions unlikely given sparing of right heart. No history of OSA, autoimmune conditions, recreational drug use, or electrolyte abnormalities. TSH and A1c normal.  - Continue carvedilol - Continue irbesartan - Start dapagliflozin - Start spironolactone  - IV lasix 40 mg daily   Hypertension  Patient found to have SBP > 230 on admission with pulmonary edema and elevated troponin. BP this morning improved to 144/90. Goal BP <130/80.  - Continue irbesartan, carvedilol, and spironolactone   Type II MI Patient presented with chest tightness. CTA negative for PE. EKG notable for sinus tachycardia with LBBB. HsTn peaked at 335, subsequently down-trending. Left heart catheterization with angiographically normal arteries. Chest tightness thought to be secondary to demand ischemia from severe hypertension. ACS unlikely in the setting of normal coronary arteries. No recurrence of chest pain - Continue to monitor  - Control blood pressure as above  Hyperlipidemia TC 216, LDL 122.  - Start atorvastatin 40 mg  Hypokalemia Secondary to lasix use. - Replete PRN   LOS: 0 days   Halina Andreas, Medical Student 10/14/2021, 8:40 AM

## 2021-10-14 NOTE — Progress Notes (Signed)
Progress Note  Patient Name: Theresa Hancock Date of Encounter: 10/14/2021  [facility] Cardiologist: None   Subjective   Denies any chest pain or dyspnea  Inpatient Medications    Scheduled Meds:  [START ON 10/15/2021] aspirin EC  81 mg Oral Daily   atorvastatin  80 mg Oral Daily   carvedilol  6.25 mg Oral BID WC   furosemide  40 mg Intravenous Once   heparin  5,000 Units Subcutaneous Q8H   irbesartan  150 mg Oral Daily   sodium chloride flush  3 mL Intravenous Q12H   sodium chloride flush  3 mL Intravenous Q12H   Continuous Infusions:  sodium chloride 100 mL/hr at 10/14/21 0959   sodium chloride     azithromycin 500 mg (10/14/21 0432)   nitroGLYCERIN Stopped (10/13/21 1303)   PRN Meds: sodium chloride, acetaminophen, hydrALAZINE, labetalol, morphine injection, ondansetron (ZOFRAN) IV, sodium chloride flush   Vital Signs    Vitals:   10/14/21 0900 10/14/21 0915 10/14/21 0930 10/14/21 1001  BP: (!) 155/75 (!) 161/82 (!) 164/94 (!) 163/91  Pulse:    86  Resp:      Temp:      TempSrc:      SpO2: 94% 94% 94%   Weight:      Height:        Intake/Output Summary (Last 24 hours) at 10/14/2021 1016 Last data filed at 10/14/2021 1015 Gross per 24 hour  Intake 386.76 ml  Output 2950 ml  Net -2563.24 ml      10/14/2021    5:46 AM 10/13/2021    7:00 PM 10/13/2021    3:00 PM  Last 3 Weights  Weight (lbs) 198 lb 12.8 oz 203 lb 7.8 oz 203 lb 7.8 oz  Weight (kg) 90.175 kg 92.3 kg 92.3 kg      Telemetry    Normal sinus rhythm- Personally Reviewed  ECG    No new ECG- Personally Reviewed  Physical Exam   GEN: No acute distress.   Neck: No JVD Cardiac: RRR, no murmurs, rubs, or gallops.  Respiratory: Clear to auscultation bilaterally. GI: Soft, nontender, non-distended  MS: trace edema; No deformity. Neuro:  Nonfocal  Psych: Normal affect   Labs    High Sensitivity Troponin:   Recent Labs  Lab 10/13/21 0832 10/13/21 1159 10/13/21 1633  10/13/21 1931 10/13/21 2236  TROPONINIHS 104* 258* 296* 335* 297*     Chemistry Recent Labs  Lab 10/13/21 0627 10/14/21 0611  NA 143 143  K 4.3 3.3*  CL 110 107  CO2 22 25  GLUCOSE 174* 143*  BUN 16 16  CREATININE 0.64 0.48  CALCIUM 9.0 8.8*  PROT 7.3 6.8  ALBUMIN 4.3 3.8  AST 78* 38  ALT 59* 62*  ALKPHOS 81 80  BILITOT 0.5 1.0  GFRNONAA >60 >60  ANIONGAP 11 11    Lipids  Recent Labs  Lab 10/13/21 1159  CHOL 216*  TRIG 77  HDL 79  LDLCALC 122*  CHOLHDL 2.7    Hematology Recent Labs  Lab 10/13/21 0627 10/14/21 0611  WBC 12.6* 10.3  RBC 5.43* 5.03  HGB 17.2* 15.8*  HCT 53.4* 47.9*  MCV 98.3 95.2  MCH 31.7 31.4  MCHC 32.2 33.0  RDW 13.2 13.1  PLT 293 268   Thyroid  Recent Labs  Lab 10/13/21 1159  TSH 0.852    BNP Recent Labs  Lab 10/13/21 0627  BNP 54.0    DDimer No results for input(s): DDIMER in the  last 168 hours.   Radiology    CT Angio Chest Pulmonary Embolism (PE) W or WO Contrast  Result Date: 10/13/2021 CLINICAL DATA:  Shortness of breath. EXAM: CT ANGIOGRAPHY CHEST WITH CONTRAST TECHNIQUE: Multidetector CT imaging of the chest was performed using the standard protocol during bolus administration of intravenous contrast. Multiplanar CT image reconstructions and MIPs were obtained to evaluate the vascular anatomy. RADIATION DOSE REDUCTION: This exam was performed according to the departmental dose-optimization program which includes automated exposure control, adjustment of the mA and/or kV according to patient size and/or use of iterative reconstruction technique. CONTRAST:  14m OMNIPAQUE IOHEXOL 350 MG/ML SOLN COMPARISON:  None Available. FINDINGS: Cardiovascular: Satisfactory opacification of the pulmonary arteries to the segmental level. No evidence of pulmonary embolism. Normal heart size. No pericardial effusion. Mediastinum/Nodes: No enlarged mediastinal, hilar, or axillary lymph nodes. Thyroid gland, trachea, and esophagus demonstrate  no significant findings. Lungs/Pleura: Minimal bilateral pleural effusions are noted. No pneumothorax is noted. Bibasilar opacities are noted concerning for edema or possibly multifocal pneumonia. Upper Abdomen: No acute abnormality. Musculoskeletal: No chest wall abnormality. No acute or significant osseous findings. Review of the MIP images confirms the above findings. IMPRESSION: No definite evidence of pulmonary embolus. Bibasilar opacities are noted concerning for edema or possibly multifocal pneumonia. Small pleural effusions are noted. Electronically Signed   By: JMarijo ConceptionM.D.   On: 10/13/2021 08:20   CARDIAC CATHETERIZATION  Result Date: 10/14/2021   Angiographically normal coronary arteries   LV end diastolic pressure is severely elevated.  Insetting of systemic hypertension   Hemodynamic findings consistent with moderate pulmonary hypertension.  PAP mean 35 million mercury   There is no aortic valve stenosis. SUMMARY Angiographically normal coronary arteries -> this would argue against ACS, and more for Hypertensive urgency/emergency Moderate to severely elevated filling pressures with reduced EF, Consistent with Combined Systolic and Diastolic Heart Failure: LVP-EDP 172/13 mmHg - 28 mmHg; AOP-MAP 173/83-123 mmHg. RHC Pressures: Mean RA 9 mmHg, RV-EDP 52/6-19 mmHg; PAP-mean 44/21-35 mmHg; PCWP 28 mmHg; Ao sat 95%, PA sat 77%; Cardiac Output-Index (Fick) 6.61-3.44. Catheter related Torsade De Pointes-(roughly 20 seconds) 1 shock with restoration of ROSC RECOMMENDATIONS Return to the nursing unit for ongoing care. Continue blood pressure control and diuresis-we will give 1 more dose of IV Lasix 2 hours post procedure to allow for post-cath hydration. DGlenetta Hew MD  DG Chest Port 1 View  Result Date: 10/13/2021 CLINICAL DATA:  71year old female with history of shortness of breath. EXAM: PORTABLE CHEST 1 VIEW COMPARISON:  No priors. FINDINGS: Lung volumes are normal. Widespread areas of  interstitial prominence, diffuse peribronchial cuffing and ill-defined opacities are noted throughout the lungs bilaterally, most severe throughout the mid to lower lungs. No pleural effusions. No pneumothorax. Pulmonary vasculature does not appearing origin. Heart size is upper limits of normal. Upper mediastinal contours are within normal limits. Atherosclerotic calcifications in the thoracic aorta. IMPRESSION: 1. The appearance the chest is concerning for acute bronchitis and developing multilobar bilateral bronchopneumonia, as above. 2. Aortic atherosclerosis. Electronically Signed   By: DVinnie LangtonM.D.   On: 10/13/2021 06:18   ECHOCARDIOGRAM COMPLETE  Result Date: 10/13/2021    ECHOCARDIOGRAM REPORT   Patient Name:   CLOU IRIGOYENDate of Exam: 10/13/2021 Medical Rec #:  [id number]      Height:       62.0 in Accession #:    [id number]     Weight:       185.0 lb Date  of Birth:  24-Apr-1951      BSA:          1.849 m Patient Age:    17 years        BP:           136/77 mmHg Patient Gender: F               HR:           87 bpm. Exam Location:  Inpatient Procedure: 2D Echo, Color Doppler and Cardiac Doppler Indications:    R06.9 DOE  History:        Patient has no prior history of Echocardiogram examinations.  Sonographer:    Raquel Sarna Senior RDCS Referring Phys: [id number] JULIE MACHEN  Sonographer Comments: Poor parasternal window due to scar tissue interference IMPRESSIONS  1. Global hypokinesis abnormal septal motion . Left ventricular ejection fraction, by estimation, is 40 to 45%. The left ventricle has mildly decreased function. The left ventricle demonstrates global hypokinesis. The left ventricular internal cavity size was mildly dilated. Left ventricular diastolic parameters are consistent with Grade I diastolic dysfunction (impaired relaxation).  2. Right ventricular systolic function is normal. The right ventricular size is normal.  3. Left atrial size was mildly dilated.  4. The mitral valve is  abnormal. Mild mitral valve regurgitation. No evidence of mitral stenosis.  5. The aortic valve is tricuspid. Aortic valve regurgitation is not visualized. No aortic stenosis is present.  6. The inferior vena cava is dilated in size with >50% respiratory variability, suggesting right atrial pressure of 8 mmHg. FINDINGS  Left Ventricle: Global hypokinesis abnormal septal motion. Left ventricular ejection fraction, by estimation, is 40 to 45%. The left ventricle has mildly decreased function. The left ventricle demonstrates global hypokinesis. The left ventricular internal cavity size was mildly dilated. There is no left ventricular hypertrophy. Left ventricular diastolic parameters are consistent with Grade I diastolic dysfunction (impaired relaxation). Right Ventricle: The right ventricular size is normal. No increase in right ventricular wall thickness. Right ventricular systolic function is normal. Left Atrium: Left atrial size was mildly dilated. Right Atrium: Right atrial size was normal in size. Pericardium: There is no evidence of pericardial effusion. Mitral Valve: The mitral valve is abnormal. There is mild thickening of the mitral valve leaflet(s). Mild mitral valve regurgitation. No evidence of mitral valve stenosis. Tricuspid Valve: The tricuspid valve is normal in structure. Tricuspid valve regurgitation is not demonstrated. No evidence of tricuspid stenosis. Aortic Valve: The aortic valve is tricuspid. Aortic valve regurgitation is not visualized. No aortic stenosis is present. Pulmonic Valve: The pulmonic valve was normal in structure. Pulmonic valve regurgitation is not visualized. No evidence of pulmonic stenosis. Aorta: The aortic root is normal in size and structure. Venous: The inferior vena cava is dilated in size with greater than 50% respiratory variability, suggesting right atrial pressure of 8 mmHg. IAS/Shunts: No atrial level shunt detected by color flow Doppler.  LEFT VENTRICLE PLAX 2D  LVIDd:         4.40 cm   Diastology LVIDs:         3.00 cm   LV e' medial:  5.98 cm/s LV PW:         0.90 cm   LV e' lateral: 7.94 cm/s LV IVS:        0.90 cm LVOT diam:     2.00 cm LV SV:         60 LV SV Index:   32 LVOT Area:  3.14 cm  RIGHT VENTRICLE RV S prime:     12.20 cm/s TAPSE (M-mode): 2.4 cm LEFT ATRIUM             Index        RIGHT ATRIUM           Index LA diam:        4.00 cm 2.16 cm/m   RA Area:     13.80 cm LA Vol (A2C):   43.0 ml 23.25 ml/m  RA Volume:   32.80 ml  17.74 ml/m LA Vol (A4C):   46.3 ml 25.04 ml/m LA Biplane Vol: 47.4 ml 25.63 ml/m  AORTIC VALVE LVOT Vmax:   101.00 cm/s LVOT Vmean:  69.100 cm/s LVOT VTI:    0.190 m  AORTA Ao Root diam: 2.70 cm Ao Asc diam:  3.10 cm  SHUNTS Systemic VTI:  0.19 m Systemic Diam: 2.00 cm Jenkins Rouge MD Electronically signed by Jenkins Rouge MD Signature Date/Time: 10/13/2021/11:46:52 AM    Final     Cardiac Studies     Patient Profile     71 y.o. female with a hx of breast cancer who is being seen for the evaluation of new CHF  Assessment & Plan    Elevated Troponin: hsTn 10>>104>>258. Patient reported chest pain and dyspnea.  Normal heart arteries on cath.  Suspect demand ischemia in setting of significantly elevated BP   HTN urgency: Initial BP 232/140.  Initially started on nitroglycerin drip has been weaned off.  BP improved but remains elevated, 163/91 this morning -Continue ARB, coreg.  Will add spironolactone.     Acute HFrEF: Echo this admission showed global hypokinesis with abnormal septal motion, LVEF 16-10%, grade I diastolic dysfunction. Suspect secondary to uncontrolled HTN.  Normal coronary arteries on cath.  Elevated pressures (RA 9, RV 52/6, PA 44/21/35, PCWP 28, PA sat 77%, CI 3.4) -Elevated filling pressures, started IV Lasix -Continue ARB, Coreg.  Will add Farxiga and spironolactone   HLD  -LDL 122, HDL 79, triglycerides 77  -Started Lipitor 40 mg daily  For questions or updates, please contact [place] Please consult [email or web address] for contact info under        Signed, Donato Heinz, MD  10/14/2021, 10:16 AM

## 2021-10-14 NOTE — TOC Progression Note (Signed)
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  She is very indep.  NCM asked if she would like a HHRN for her CHF, she states no.  She states her nephew will transport her home at discharge.  She has Medicare part A insurance.  NCM took copy of Medicare card to admissions.  They called up to unit to say they could not put in the insurance information yet.  TOC will continue to follow for dc needs.    Expected Discharge Plan: Home/Self Care Barriers to Discharge: Continued Medical Work up  Expected Discharge Plan and Services Expected Discharge Plan: Home/Self Care In-house Referral: NA Discharge Planning Services: CM Consult Post Acute Care Choice: NA Living arrangements for the past 2 months: Single Family Home                   DME Agency: NA       HH Arranged: NA           Social Determinants of Health (SDOH) Interventions    Readmission Risk Interventions     View : No data to display.

## 2021-10-14 NOTE — Interval H&P Note (Signed)
History and Physical Interval Note:  10/14/2021 7:50 AM  Theresa Hancock  has presented today for surgery, with the diagnosis of acute coronary syndrome / cardiomyopathy.  The various methods of treatment have been discussed with the patient and family. After consideration of risks, benefits and other options for treatment, the patient has consented to  Procedure(s): RIGHT/LEFT HEART CATH AND CORONARY ANGIOGRAPHY (N/A)  PERCUTANEOUS CORONARY INTERVENTION   as a surgical intervention.  The patient's history has been reviewed, patient examined, no change in status, stable for surgery.  I have reviewed the patient's chart and labs.  Questions were answered to the patient's satisfaction.     Cath Lab Visit (complete for each Cath Lab visit)  Clinical Evaluation Leading to the Procedure:   ACS: Yes.    Non-ACS:    Anginal Classification: CCS III  Anti-ischemic medical therapy: Minimal Therapy (1 class of medications)  Non-Invasive Test Results: High-risk stress test findings: cardiac mortality >3%/year  Prior CABG: No previous CABG    Glenetta Hew

## 2021-10-15 LAB — CBC
HCT: 47.3 % — ABNORMAL HIGH (ref 36.0–46.0)
Hemoglobin: 15.4 g/dL — ABNORMAL HIGH (ref 12.0–15.0)
MCH: 31.1 pg (ref 26.0–34.0)
MCHC: 32.6 g/dL (ref 30.0–36.0)
MCV: 95.6 fL (ref 80.0–100.0)
Platelets: 256 10*3/uL (ref 150–400)
RBC: 4.95 MIL/uL (ref 3.87–5.11)
RDW: 13.2 % (ref 11.5–15.5)
WBC: 11.8 10*3/uL — ABNORMAL HIGH (ref 4.0–10.5)
nRBC: 0 % (ref 0.0–0.2)

## 2021-10-15 LAB — BASIC METABOLIC PANEL
Anion gap: 9 (ref 5–15)
BUN: 19 mg/dL (ref 8–23)
CO2: 23 mmol/L (ref 22–32)
Calcium: 9.1 mg/dL (ref 8.9–10.3)
Chloride: 109 mmol/L (ref 98–111)
Creatinine, Ser: 0.51 mg/dL (ref 0.44–1.00)
GFR, Estimated: >60 mL/min (ref >60)
Glucose, Bld: 127 mg/dL — ABNORMAL HIGH (ref 70–99)
Potassium: 3.8 mmol/L (ref 3.5–5.1)
Sodium: 141 mmol/L (ref 135–145)

## 2021-10-15 LAB — MAGNESIUM: Magnesium: 2.2 mg/dL (ref 1.7–2.4)

## 2021-10-15 MED ORDER — FUROSEMIDE 40 MG PO TABS
40.0000 mg | ORAL_TABLET | Freq: Every day | ORAL | Status: DC
  Administered 2021-10-16: 40 mg via ORAL
  Filled 2021-10-15: qty 1

## 2021-10-15 MED ORDER — SACUBITRIL-VALSARTAN 24-26 MG PO TABS
1.0000 | ORAL_TABLET | Freq: Two times a day (BID) | ORAL | Status: DC
  Administered 2021-10-15 – 2021-10-16 (×3): 1 via ORAL
  Filled 2021-10-15 (×3): qty 1

## 2021-10-15 MED ORDER — POTASSIUM CHLORIDE CRYS ER 20 MEQ PO TBCR
40.0000 meq | EXTENDED_RELEASE_TABLET | Freq: Once | ORAL | Status: AC
  Administered 2021-10-15: 40 meq via ORAL
  Filled 2021-10-15: qty 2

## 2021-10-15 NOTE — Progress Notes (Addendum)
Progress Note  Patient Name: Theresa Hancock Date of Encounter: 10/15/2021  [facility] Cardiologist: None   Subjective   Net -2.3 L yesterday, -3.9 L on admission.  BP 154/74 this morning.  Creatinine stable at 0.5.  Potassium 3.8, magnesium 2.2.  Weight is down 7 pounds from admission.  Denies any chest pain or dyspnea.  Inpatient Medications    Scheduled Meds:  aspirin EC  81 mg Oral Daily   atorvastatin  40 mg Oral Daily   carvedilol  6.25 mg Oral BID WC   dapagliflozin propanediol  10 mg Oral Daily   furosemide  40 mg Intravenous Daily   heparin  5,000 Units Subcutaneous Q8H   irbesartan  150 mg Oral Daily   sodium chloride flush  3 mL Intravenous Q12H   spironolactone  25 mg Oral Daily   Continuous Infusions:  azithromycin 500 mg (10/15/21 0558)   PRN Meds: acetaminophen, morphine injection, ondansetron (ZOFRAN) IV, sodium chloride flush   Vital Signs    Vitals:   10/15/21 0007 10/15/21 0401 10/15/21 0402 10/15/21 0733  BP: (!) 144/75  (!) 143/73 (!) 154/74  Pulse:    78  Resp: '17  19 20  '$ Temp: 98.8 F (37.1 C)  98.6 F (37 C) 98.6 F (37 C)  TempSrc: Oral  Oral Oral  SpO2: 97%  96% 92%  Weight:  89.1 kg    Height:        Intake/Output Summary (Last 24 hours) at 10/15/2021 0754 Last data filed at 10/15/2021 0600 Gross per 24 hour  Intake 662.35 ml  Output 2950 ml  Net -2287.65 ml       10/15/2021    4:01 AM 10/14/2021    5:46 AM 10/13/2021    7:00 PM  Last 3 Weights  Weight (lbs) 196 lb 6.9 oz 198 lb 12.8 oz 203 lb 7.8 oz  Weight (kg) 89.1 kg 90.175 kg 92.3 kg      Telemetry    Normal sinus rhythm- Personally Reviewed  ECG    No new ECG- Personally Reviewed  Physical Exam   GEN: No acute distress.   Neck: No JVD Cardiac: RRR, no murmurs, rubs, or gallops.  Respiratory: Clear to auscultation bilaterally. GI: Soft, nontender, non-distended  MS: trace edema; No deformity. Neuro:  Nonfocal  Psych: Normal affect   Labs    High  Sensitivity Troponin:   Recent Labs  Lab 10/13/21 0832 10/13/21 1159 10/13/21 1633 10/13/21 1931 10/13/21 2236  TROPONINIHS 104* 258* 296* 335* 297*      Chemistry Recent Labs  Lab 10/13/21 0627 10/14/21 0611 10/14/21 0806 10/14/21 0820 10/15/21 0302  NA 143 143 143 145  145 141  K 4.3 3.3* 3.3* 3.2*  3.2* 3.8  CL 110 107  --   --  109  CO2 22 25  --   --  23  GLUCOSE 174* 143*  --   --  127*  BUN 16 16  --   --  19  CREATININE 0.64 0.48  --   --  0.51  CALCIUM 9.0 8.8*  --   --  9.1  MG  --  2.1  --   --  2.2  PROT 7.3 6.8  --   --   --   ALBUMIN 4.3 3.8  --   --   --   AST 78* 38  --   --   --   ALT 59* 62*  --   --   --  ALKPHOS 81 80  --   --   --   BILITOT 0.5 1.0  --   --   --   GFRNONAA >60 >60  --   --  >60  ANIONGAP 11 11  --   --  9     Lipids  Recent Labs  Lab 10/13/21 1159  CHOL 216*  TRIG 77  HDL 79  LDLCALC 122*  CHOLHDL 2.7     Hematology Recent Labs  Lab 10/13/21 0627 10/14/21 0611 10/14/21 0806 10/14/21 0820 10/15/21 0302  WBC 12.6* 10.3  --   --  11.8*  RBC 5.43* 5.03  --   --  4.95  HGB 17.2* 15.8* 15.6* 15.3*  15.3* 15.4*  HCT 53.4* 47.9* 46.0 45.0  45.0 47.3*  MCV 98.3 95.2  --   --  95.6  MCH 31.7 31.4  --   --  31.1  MCHC 32.2 33.0  --   --  32.6  RDW 13.2 13.1  --   --  13.2  PLT 293 268  --   --  256    Thyroid  Recent Labs  Lab 10/13/21 1159  TSH 0.852     BNP Recent Labs  Lab 10/13/21 0627  BNP 54.0     DDimer No results for input(s): DDIMER in the last 168 hours.   Radiology    CT Angio Chest Pulmonary Embolism (PE) W or WO Contrast  Result Date: 10/13/2021 CLINICAL DATA:  Shortness of breath. EXAM: CT ANGIOGRAPHY CHEST WITH CONTRAST TECHNIQUE: Multidetector CT imaging of the chest was performed using the standard protocol during bolus administration of intravenous contrast. Multiplanar CT image reconstructions and MIPs were obtained to evaluate the vascular anatomy. RADIATION DOSE REDUCTION: This  exam was performed according to the departmental dose-optimization program which includes automated exposure control, adjustment of the mA and/or kV according to patient size and/or use of iterative reconstruction technique. CONTRAST:  21m OMNIPAQUE IOHEXOL 350 MG/ML SOLN COMPARISON:  None Available. FINDINGS: Cardiovascular: Satisfactory opacification of the pulmonary arteries to the segmental level. No evidence of pulmonary embolism. Normal heart size. No pericardial effusion. Mediastinum/Nodes: No enlarged mediastinal, hilar, or axillary lymph nodes. Thyroid gland, trachea, and esophagus demonstrate no significant findings. Lungs/Pleura: Minimal bilateral pleural effusions are noted. No pneumothorax is noted. Bibasilar opacities are noted concerning for edema or possibly multifocal pneumonia. Upper Abdomen: No acute abnormality. Musculoskeletal: No chest wall abnormality. No acute or significant osseous findings. Review of the MIP images confirms the above findings. IMPRESSION: No definite evidence of pulmonary embolus. Bibasilar opacities are noted concerning for edema or possibly multifocal pneumonia. Small pleural effusions are noted. Electronically Signed   By: JMarijo ConceptionM.D.   On: 10/13/2021 08:20   CARDIAC CATHETERIZATION  Result Date: 10/14/2021   Angiographically normal coronary arteries   LV end diastolic pressure is severely elevated.  Insetting of systemic hypertension   Hemodynamic findings consistent with moderate pulmonary hypertension.  PAP mean 35 million mercury   There is no aortic valve stenosis. SUMMARY Angiographically normal coronary arteries -> this would argue against ACS, and more for Hypertensive urgency/emergency Moderate to severely elevated filling pressures with reduced EF, Consistent with Combined Systolic and Diastolic Heart Failure: LVP-EDP 172/13 mmHg - 28 mmHg; AOP-MAP 173/83-123 mmHg. RHC Pressures: Mean RA 9 mmHg, RV-EDP 52/6-19 mmHg; PAP-mean 44/21-35 mmHg; PCWP 28  mmHg; Ao sat 95%, PA sat 77%; Cardiac Output-Index (Fick) 6.61-3.44. Catheter related Torsade De Pointes-(roughly 20 seconds) 1 shock with restoration of ROSC  RECOMMENDATIONS Return to the nursing unit for ongoing care. Continue blood pressure control and diuresis-we will give 1 more dose of IV Lasix 2 hours post procedure to allow for post-cath hydration. Glenetta Hew, MD  ECHOCARDIOGRAM COMPLETE  Result Date: 10/13/2021    ECHOCARDIOGRAM REPORT   Patient Name:   ALLEGRA CERNIGLIA Date of Exam: 10/13/2021 Medical Rec #:  [id number]       Height:       62.0 in Accession #:    [id number]      Weight:       185.0 lb Date of Birth:  Oct 09, 1950      BSA:          1.849 m Patient Age:    21 years        BP:           136/77 mmHg Patient Gender: F               HR:           87 bpm. Exam Location:  Inpatient Procedure: 2D Echo, Color Doppler and Cardiac Doppler Indications:    R06.9 DOE  History:        Patient has no prior history of Echocardiogram examinations.  Sonographer:    Raquel Sarna Senior RDCS Referring Phys: [id number] JULIE MACHEN  Sonographer Comments: Poor parasternal window due to scar tissue interference IMPRESSIONS  1. Global hypokinesis abnormal septal motion . Left ventricular ejection fraction, by estimation, is 40 to 45%. The left ventricle has mildly decreased function. The left ventricle demonstrates global hypokinesis. The left ventricular internal cavity size was mildly dilated. Left ventricular diastolic parameters are consistent with Grade I diastolic dysfunction (impaired relaxation).  2. Right ventricular systolic function is normal. The right ventricular size is normal.  3. Left atrial size was mildly dilated.  4. The mitral valve is abnormal. Mild mitral valve regurgitation. No evidence of mitral stenosis.  5. The aortic valve is tricuspid. Aortic valve regurgitation is not visualized. No aortic stenosis is present.  6. The inferior vena cava is dilated in size with >50% respiratory variability,  suggesting right atrial pressure of 8 mmHg. FINDINGS  Left Ventricle: Global hypokinesis abnormal septal motion. Left ventricular ejection fraction, by estimation, is 40 to 45%. The left ventricle has mildly decreased function. The left ventricle demonstrates global hypokinesis. The left ventricular internal cavity size was mildly dilated. There is no left ventricular hypertrophy. Left ventricular diastolic parameters are consistent with Grade I diastolic dysfunction (impaired relaxation). Right Ventricle: The right ventricular size is normal. No increase in right ventricular wall thickness. Right ventricular systolic function is normal. Left Atrium: Left atrial size was mildly dilated. Right Atrium: Right atrial size was normal in size. Pericardium: There is no evidence of pericardial effusion. Mitral Valve: The mitral valve is abnormal. There is mild thickening of the mitral valve leaflet(s). Mild mitral valve regurgitation. No evidence of mitral valve stenosis. Tricuspid Valve: The tricuspid valve is normal in structure. Tricuspid valve regurgitation is not demonstrated. No evidence of tricuspid stenosis. Aortic Valve: The aortic valve is tricuspid. Aortic valve regurgitation is not visualized. No aortic stenosis is present. Pulmonic Valve: The pulmonic valve was normal in structure. Pulmonic valve regurgitation is not visualized. No evidence of pulmonic stenosis. Aorta: The aortic root is normal in size and structure. Venous: The inferior vena cava is dilated in size with greater than 50% respiratory variability, suggesting right atrial pressure of 8 mmHg. IAS/Shunts: No atrial level shunt detected by color  flow Doppler.  LEFT VENTRICLE PLAX 2D LVIDd:         4.40 cm   Diastology LVIDs:         3.00 cm   LV e' medial:  5.98 cm/s LV PW:         0.90 cm   LV e' lateral: 7.94 cm/s LV IVS:        0.90 cm LVOT diam:     2.00 cm LV SV:         60 LV SV Index:   32 LVOT Area:     3.14 cm  RIGHT VENTRICLE RV S prime:      12.20 cm/s TAPSE (M-mode): 2.4 cm LEFT ATRIUM             Index        RIGHT ATRIUM           Index LA diam:        4.00 cm 2.16 cm/m   RA Area:     13.80 cm LA Vol (A2C):   43.0 ml 23.25 ml/m  RA Volume:   32.80 ml  17.74 ml/m LA Vol (A4C):   46.3 ml 25.04 ml/m LA Biplane Vol: 47.4 ml 25.63 ml/m  AORTIC VALVE LVOT Vmax:   101.00 cm/s LVOT Vmean:  69.100 cm/s LVOT VTI:    0.190 m  AORTA Ao Root diam: 2.70 cm Ao Asc diam:  3.10 cm  SHUNTS Systemic VTI:  0.19 m Systemic Diam: 2.00 cm Jenkins Rouge MD Electronically signed by Jenkins Rouge MD Signature Date/Time: 10/13/2021/11:46:52 AM    Final     Cardiac Studies     Patient Profile     71 y.o. female with a hx of breast cancer who is being seen for the evaluation of new CHF  Assessment & Plan    Elevated Troponin: hsTn 10>>104>>258. Patient reported chest pain and dyspnea.  Normal heart arteries on cath.  Suspect demand ischemia in setting of significantly elevated BP   HTN urgency: Initial BP 232/140.  Initially started on nitroglycerin drip has been weaned off.  BP improved -Continue Coreg 6.25 mg twice daily -Continue spironolactone 25 mg daily -Switch from irbesartan to Entresto 24-26 mg twice daily   Acute HFrEF: Echo this admission showed global hypokinesis with abnormal septal motion, LVEF 28-31%, grade I diastolic dysfunction. Suspect secondary to uncontrolled HTN.  Normal coronary arteries on cath.  Elevated pressures (RA 9, RV 52/6, PA 44/21/35, PCWP 28, PA sat 77%, CI 3.4) -Elevated filling pressures, started IV Lasix.  Continue IV Lasix today, suspect can transition to p.o. Lasix tomorrow -Continue Coreg, spironolactone, [place]ade.  Switch from irbesartan to [facility] as above   HLD  -LDL 122, HDL 79, triglycerides 77  -Started Lipitor 40 mg daily  For questions or updates, please contact [place] Please consult [email or web address] for contact info under        Signed, Donato Heinz, MD  10/15/2021, 7:54 AM

## 2021-10-15 NOTE — Progress Notes (Signed)
Subjective:   Hospital day:2  Overnight event: No acute events overnight  Interim History: Pt seen at bedside during rounds today. She reports feeling well, sleeping well, and eating well. She has no complaints today. Denied any SOB, cough, fever/chills, chest pain or abd pain. She is updated on the plan for today and all questions and concerns are addressed.   Objective:  Vital signs in last 24 hours: Vitals:   10/14/21 1931 10/15/21 0007 10/15/21 0401 10/15/21 0402  BP: (!) 158/90 (!) 144/75  (!) 143/73  Pulse:      Resp: '17 17  19  '$ Temp: 98.7 F (37.1 C) 98.8 F (37.1 C)  98.6 F (37 C)  TempSrc: Oral Oral  Oral  SpO2: 96% 97%  96%  Weight:   89.1 kg   Height:        Filed Weights   10/13/21 1900 10/14/21 0546 10/15/21 0401  Weight: 92.3 kg 90.2 kg 89.1 kg     Intake/Output Summary (Last 24 hours) at 10/15/2021 0708 Last data filed at 10/15/2021 0600 Gross per 24 hour  Intake 662.35 ml  Output 2950 ml  Net -2287.65 ml   Net IO Since Admission: -3,900.89 mL [10/15/21 0708]  No results for input(s): GLUCAP in the last 72 hours.   Pertinent Labs:    Latest Ref Rng & Units 10/15/2021    3:02 AM 10/14/2021    8:20 AM 10/14/2021    8:06 AM  CBC  WBC 4.0 - 10.5 K/uL 11.8      Hemoglobin 12.0 - 15.0 g/dL 15.4   15.3     15.3   15.6    Hematocrit 36.0 - 46.0 % 47.3   45.0     45.0   46.0    Platelets 150 - 400 K/uL 256           Latest Ref Rng & Units 10/15/2021    3:02 AM 10/14/2021    8:20 AM 10/14/2021    8:06 AM  CMP  Glucose 70 - 99 mg/dL 127      BUN 8 - 23 mg/dL 19      Creatinine 0.44 - 1.00 mg/dL 0.51      Sodium 135 - 145 mmol/L 141   145     145   143    Potassium 3.5 - 5.1 mmol/L 3.8   3.2     3.2   3.3    Chloride 98 - 111 mmol/L 109      CO2 22 - 32 mmol/L 23      Calcium 8.9 - 10.3 mg/dL 9.1        Imaging: CARDIAC CATHETERIZATION  Result Date: 10/14/2021   Angiographically normal coronary arteries   LV end diastolic pressure is  severely elevated.  Insetting of systemic hypertension   Hemodynamic findings consistent with moderate pulmonary hypertension.  PAP mean 35 million mercury   There is no aortic valve stenosis. SUMMARY Angiographically normal coronary arteries -> this would argue against ACS, and more for Hypertensive urgency/emergency Moderate to severely elevated filling pressures with reduced EF, Consistent with Combined Systolic and Diastolic Heart Failure: LVP-EDP 172/13 mmHg - 28 mmHg; AOP-MAP 173/83-123 mmHg. RHC Pressures: Mean RA 9 mmHg, RV-EDP 52/6-19 mmHg; PAP-mean 44/21-35 mmHg; PCWP 28 mmHg; Ao sat 95%, PA sat 77%; Cardiac Output-Index (Fick) 6.61-3.44. Catheter related Torsade De Pointes-(roughly 20 seconds) 1 shock with restoration of ROSC RECOMMENDATIONS Return to the nursing unit for ongoing care. Continue blood pressure control and diuresis-we  will give 1 more dose of IV Lasix 2 hours post procedure to allow for post-cath hydration. Glenetta Hew, MD   Physical Exam  General: Pleasant, well-appearing elderly woman sitting in chair. No acute distress. CV: RRR. No m/r/g. Trace BLE edema.  Pulmonary: Lungs CTAB. Normal effort. No wheezing or rales. Abdominal: Soft, nontender, nondistended. Normal bowel sounds. Extremities: 2+ distal pulses. Normal ROM. Skin: Warm and dry. No obvious rash or lesions. Neuro: A&Ox3. Moves all extremities. Normal sensation to gross touch.  Psych: Normal mood and affect   Assessment/Plan: Theresa Hancock is a 71 y.o. female with hx of breast cancer in 1970s s/p chemotherapy and bilateral mastectomy presented with acute onset dyspnea, admitted for severe asymptomatic hypertension and found to have acute HFmrEF. S/p LHC with normal coronaries.   Principal Problem:   Hypertensive urgency Active Problems:   Acute systolic heart failure (HCC)   Elevated troponin   Acute respiratory failure with hypoxia (HCC)   Demand ischemia (HCC)   Non-ischemic cardiomyopathy  New  HFmrEF  Echo 5/19 showed EF 40-45%, mild LV cavity dilation with global hypokinesis, and G1DD. Left heart    catheterization with angiographically normal coronary arteries. Getting IV diuresing due to elevated filling pressures. Making appropriately UOP. Net I/O of -2.9 L over the last 24 hrs and -3.9 since admission. Weight is down 7 lbs since admission. Kidney function stable. Normal mag. Has trace BLE edema but no rales on auscultation of the lungs. Denies any SOB today.  --Cardiology following, appreciate recs --Continue IV lasix 40 mg today, transition to PO tomorrow --Switch Irbesartan to Entresto 24-26 mg BID per cards --Continue Coreg, spirolactone and farxiga --Keep K+ >4.0, Mag > 2.0 --Daily weights, Strict I&Os.    Severe Hypertension  Presented with SBP > 230 on admission with pulmonary edema and elevated troponin. Started on spiro and farxiga by cards yesterday. SBP improved to the 140-150s this AM.  --Continue coreg 6.25 mg BID --Continue Spirolactone 25 mg daily --Switch Irbesartan to Entresto 24-26 mg BID per cards  Pneumonia? CTA on admission showed some bibasilar opacities concerning for edema or multifocal pneumonia. Pt had mild leukocytosis 12.6, down to 11.8 today. Patient started on azithromycin. Patient denies any cough, has remain afebrile and leukocytosis trending down. She has already received 3 doses of azithro. In the setting of new heart failure diagnosis, CXR findings likely edema rather than an infectious etiology.  --Discontinue azithromycin --Trend CBC, fever curve  Demand ischemia Troponemia Patient presented with chest tightness, elevated troponin and new LBBB on EKG. Normal LHC by cards. No new chest pain.  --Continue BP management as above  Hyperlipidemia TC 216, LDL 122.  --Continue atorvastatin 40 mg --Continue ASA 81 mg daily   Diet: HH IVF: None VTE: Heparin CODE: DNR  Prior to Admission Living Arrangement: Home Anticipated Discharge  Location: Home Barriers to Discharge: Medical stability Dispo: Anticipated discharge tomorrow.  Signed: Lacinda Axon, MD 10/15/2021, 7:08 AM  Pager: [phone] Internal Medicine Teaching Service After 5pm on weekdays and 1pm on weekends: On Call pager: [phone]

## 2021-10-16 DIAGNOSIS — J9601 Acute respiratory failure with hypoxia: Secondary | ICD-10-CM

## 2021-10-16 LAB — CBC
HCT: 49.4 % — ABNORMAL HIGH (ref 36.0–46.0)
Hemoglobin: 16.7 g/dL — ABNORMAL HIGH (ref 12.0–15.0)
MCH: 31.7 pg (ref 26.0–34.0)
MCHC: 33.8 g/dL (ref 30.0–36.0)
MCV: 93.9 fL (ref 80.0–100.0)
Platelets: 265 10*3/uL (ref 150–400)
RBC: 5.26 MIL/uL — ABNORMAL HIGH (ref 3.87–5.11)
RDW: 12.9 % (ref 11.5–15.5)
WBC: 10.2 10*3/uL (ref 4.0–10.5)
nRBC: 0 % (ref 0.0–0.2)

## 2021-10-16 LAB — BASIC METABOLIC PANEL
Anion gap: 9 (ref 5–15)
BUN: 14 mg/dL (ref 8–23)
CO2: 23 mmol/L (ref 22–32)
Calcium: 9.1 mg/dL (ref 8.9–10.3)
Chloride: 107 mmol/L (ref 98–111)
Creatinine, Ser: 0.46 mg/dL (ref 0.44–1.00)
GFR, Estimated: >60 mL/min (ref >60)
Glucose, Bld: 134 mg/dL — ABNORMAL HIGH (ref 70–99)
Potassium: 3.8 mmol/L (ref 3.5–5.1)
Sodium: 139 mmol/L (ref 135–145)

## 2021-10-16 LAB — LIPOPROTEIN A (LPA): Lipoprotein (a): 14.4 nmol/L (ref <75.0)

## 2021-10-16 LAB — MAGNESIUM: Magnesium: 2.2 mg/dL (ref 1.7–2.4)

## 2021-10-16 MED ORDER — ASPIRIN 81 MG PO TBEC: 81.0000 mg | DELAYED_RELEASE_TABLET | Freq: Every day | ORAL | 12 refills | Status: DC

## 2021-10-16 MED ORDER — CARVEDILOL 12.5 MG PO TABS
12.5000 mg | ORAL_TABLET | Freq: Two times a day (BID) | ORAL | Status: DC
  Administered 2021-10-16: 12.5 mg via ORAL
  Filled 2021-10-16: qty 1

## 2021-10-16 MED ORDER — CARVEDILOL 12.5 MG PO TABS: 12.5000 mg | ORAL_TABLET | Freq: Two times a day (BID) | ORAL | Status: DC

## 2021-10-16 MED ORDER — DAPAGLIFLOZIN PROPANEDIOL 10 MG PO TABS: 10.0000 mg | ORAL_TABLET | Freq: Every day | ORAL | 5 refills | Status: DC

## 2021-10-16 MED ORDER — SPIRONOLACTONE 25 MG PO TABS: 25.0000 mg | ORAL_TABLET | Freq: Every day | ORAL | 5 refills | Status: DC

## 2021-10-16 MED ORDER — FUROSEMIDE 40 MG PO TABS: 40.0000 mg | ORAL_TABLET | Freq: Every day | ORAL | 5 refills | Status: DC

## 2021-10-16 MED ORDER — CARVEDILOL 12.5 MG PO TABS: 12.5000 mg | ORAL_TABLET | Freq: Two times a day (BID) | ORAL | 5 refills | Status: DC

## 2021-10-16 MED ORDER — SACUBITRIL-VALSARTAN 24-26 MG PO TABS: 1.0000 | ORAL_TABLET | Freq: Two times a day (BID) | ORAL | 5 refills | Status: DC

## 2021-10-16 MED ORDER — ATORVASTATIN CALCIUM 40 MG PO TABS: 40.0000 mg | ORAL_TABLET | Freq: Every day | ORAL | 5 refills | Status: DC

## 2021-10-16 NOTE — Evaluation (Signed)
Physical Therapy Evaluation and Discharge  Patient Details Name: Theresa Hancock MRN: [id number] DOB: 1951/03/15 Today's Date: 10/16/2021  History of Present Illness  Pt is a 71 y/o female who presents with acute onset dyspnea, and was admitted for hypertensive urgency and found to have non-ischemic cardiomyopathy and new HFmrEF. PMH significant for Breast CA s/p bilateral total mastectomy with axillary lymph node dissection, total hysterectomy.   Clinical Impression  Patient evaluated by Physical Therapy with no further acute PT needs identified. All education has been completed and the patient has no further questions. At the time of PT eval pt was able to perform transfers and ambulation with gross modified independence and no AD. Pt with no noted DOE with ambulation ~500'. Pt on RA throughout session and O2 sats remained in mid-high 90's. BP before ambulation at rest 131/75, and after ambulation 145/80. See below for any follow-up Physical Therapy or equipment needs. PT is signing off. Thank you for this referral.        Recommendations for follow up therapy are one component of a multi-disciplinary discharge planning process, led by the attending physician.  Recommendations may be updated based on patient status, additional functional criteria and insurance authorization.  Follow Up Recommendations No PT follow up    Assistance Recommended at Discharge PRN  Patient can return home with the following  Assistance with cooking/housework;Assist for transportation    Equipment Recommendations None recommended by PT  Recommendations for Other Services       Functional Status Assessment Patient has had a recent decline in their functional status and demonstrates the ability to make significant improvements in function in a reasonable and predictable amount of time.     Precautions / Restrictions Precautions Precautions: None Restrictions Weight Bearing Restrictions: No       Mobility  Bed Mobility               General bed mobility comments: Pt was received sitting up in the recliner chair.    Transfers Overall transfer level: Modified independent Equipment used: None               General transfer comment: Pt easily able to power up to full stand from recliner and EOB. No assist required and no unsteadiness noted.    Ambulation/Gait Ambulation/Gait assistance: Modified independent (Device/Increase time) Gait Distance (Feet): 500 Feet Assistive device: None Gait Pattern/deviations: Step-through pattern, Decreased stride length, Wide base of support, Trendelenburg Gait velocity: Decreased Gait velocity interpretation: 1.31 - 2.62 ft/sec, indicative of limited community ambulator   General Gait Details: Slow and mildly guarded. Pt reports she is pacing herself so she doesn't get SOB. No dyspnea noted throughout gait training. No overt LOB noted. Pt maintained O2 sats 94-98% on RA throughout gait training.  Stairs            Wheelchair Mobility    Modified Rankin (Stroke Patients Only)       Balance Overall balance assessment: Mild deficits observed, not formally tested                                           Pertinent Vitals/Pain Pain Assessment Pain Assessment: No/denies pain    Home Living Family/patient expects to be discharged to:: Private residence Living Arrangements: Parent;Other relatives Available Help at Discharge: Family;Available PRN/intermittently Type of Home: House Home Access: Stairs to enter;Ramped entrance Entrance Stairs-Rails: None  Entrance Stairs-Number of Steps: 2 and the threshold   Home Layout: One level (1 step down into sunroom) Home Equipment: Shower seat;BSC/3in1;Wheelchair - manual;Rollator (4 wheels)      Prior Function Prior Level of Function : Independent/Modified Independent             Mobility Comments: 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  Mother is independent but slow, and pt helps with ADL's for speed.       Hand Dominance        Extremity/Trunk Assessment   Upper Extremity Assessment Upper Extremity Assessment: Overall WFL for tasks assessed    Lower Extremity Assessment Lower Extremity Assessment: Generalized weakness (Mild)    Cervical / Trunk Assessment Cervical / Trunk Assessment: Normal  Communication   Communication: No difficulties  Cognition Arousal/Alertness: Awake/alert Behavior During Therapy: WFL for tasks assessed/performed Overall Cognitive Status: Within Functional Limits for tasks assessed                                          General Comments      Exercises     Assessment/Plan    PT Assessment Patient does not need any further PT services  PT Problem List         PT Treatment Interventions      PT Goals (Current goals can be found in the Care Plan section)  Acute Rehab PT Goals Patient Stated Goal: Return home and take over caregiving of her mother PT Goal Formulation: All assessment and education complete, DC therapy    Frequency       Co-evaluation               AM-PAC PT "6 Clicks" Mobility  Outcome Measure Help needed turning from your back to your side while in a flat bed without using bedrails?: None Help needed moving from lying on your back to sitting on the side of a flat bed without using bedrails?: None Help needed moving to and from a bed to a chair (including a wheelchair)?: None Help needed standing up from a chair using your arms (e.g., wheelchair or bedside chair)?: None Help needed to walk in hospital room?: None Help needed climbing 3-5 steps with a railing? : None 6 Click Score: 24    End of Session   Activity Tolerance: Patient tolerated treatment well Patient left: in chair;with call bell/phone within reach Nurse Communication: Mobility status PT Visit Diagnosis: Difficulty in walking, not elsewhere classified  (R26.2)    Time: 9528-4132 PT Time Calculation (min) (ACUTE ONLY): 21 min   Charges:   PT Evaluation $PT Eval Moderate Complexity: 1 Mod          Rolinda Roan, PT, DPT Acute Rehabilitation Services Secure Chat Preferred Office: [phone]   Thelma Comp 10/16/2021, 9:39 AM

## 2021-10-16 NOTE — TOC Transition Note (Signed)
Transition of Care [facility]) - CM/SW Discharge Note   Patient Details  Name: Theresa Hancock MRN: [id number] Date of Birth: April 22, 1951  Transition of Care [facility]) CM/SW Contact:  Carles Collet, RN Phone Number: 10/16/2021, 10:51 AM   Clinical Narrative:   Damaris Schooner w patient at bedside. She states that she applied for Medicare AB and D She states that her card that came in the mail only has A on it. She is going to follow up on this Monday.  Provided her with 30 day free coupons for [place]. She states that this should get her through until she can get her information from Medicare.     Final next level of care: Home/Self Care Barriers to Discharge: Continued Medical Work up   Patient Goals and CMS Choice Patient states their goals for this hospitalization and ongoing recovery are:: return home   Choice offered to / list presented to : NA  Discharge Placement                       Discharge Plan and Services In-house Referral: NA Discharge Planning Services: CM Consult Post Acute Care Choice: NA            DME Agency: NA       HH Arranged: NA          Social Determinants of Health (SDOH) Interventions     Readmission Risk Interventions     View : No data to display.

## 2021-10-16 NOTE — Progress Notes (Signed)
Progress Note  Patient Name: Theresa Hancock Date of Encounter: 10/16/2021  [facility] Cardiologist: None   Subjective   Net -3.1 L yesterday, -7.0 L on admission.  BP 144/97 this morning.  Creatinine stable at 0.5.  Potassium 3.8, magnesium 2.2.  Weight is down 8 pounds from admission.  Denies any chest pain or dyspnea.  Inpatient Medications    Scheduled Meds:  aspirin EC  81 mg Oral Daily   atorvastatin  40 mg Oral Daily   carvedilol  12.5 mg Oral BID WC   dapagliflozin propanediol  10 mg Oral Daily   furosemide  40 mg Oral Daily   heparin  5,000 Units Subcutaneous Q8H   sacubitril-valsartan  1 tablet Oral BID   sodium chloride flush  3 mL Intravenous Q12H   spironolactone  25 mg Oral Daily   Continuous Infusions:   PRN Meds: acetaminophen, morphine injection, ondansetron (ZOFRAN) IV, sodium chloride flush   Vital Signs    Vitals:   10/15/21 1608 10/15/21 1924 10/16/21 0410 10/16/21 0811  BP: (!) 155/75 (!) 146/59 (!) 146/94 (!) 144/97  Pulse: 82   89  Resp: '20 19 18 18  '$ Temp: 97.9 F (36.6 C) 98 F (36.7 C) 98 F (36.7 C)   TempSrc: Oral Oral Oral   SpO2: 98%  98% 97%  Weight:   88.5 kg   Height:        Intake/Output Summary (Last 24 hours) at 10/16/2021 0855 Last data filed at 10/16/2021 0816 Gross per 24 hour  Intake 300 ml  Output 3350 ml  Net -3050 ml       10/16/2021    4:10 AM 10/15/2021    4:01 AM 10/14/2021    5:46 AM  Last 3 Weights  Weight (lbs) 195 lb 1.6 oz 196 lb 6.9 oz 198 lb 12.8 oz  Weight (kg) 88.497 kg 89.1 kg 90.175 kg      Telemetry    Normal sinus rhythm 70-90s- Personally Reviewed  ECG    No new ECG- Personally Reviewed  Physical Exam   GEN: No acute distress.   Neck: No JVD Cardiac: RRR, no murmurs, rubs, or gallops.  Respiratory: Clear to auscultation bilaterally. GI: Soft, nontender, non-distended  MS: trace edema; No deformity. Neuro:  Nonfocal  Psych: Normal affect   Labs    High Sensitivity  Troponin:   Recent Labs  Lab 10/13/21 0832 10/13/21 1159 10/13/21 1633 10/13/21 1931 10/13/21 2236  TROPONINIHS 104* 258* 296* 335* 297*      Chemistry Recent Labs  Lab 10/13/21 0627 10/14/21 0611 10/14/21 0806 10/14/21 0820 10/15/21 0302 10/16/21 0325  NA 143 143   < > 145  145 141 139  K 4.3 3.3*   < > 3.2*  3.2* 3.8 3.8  CL 110 107  --   --  109 107  CO2 22 25  --   --  23 23  GLUCOSE 174* 143*  --   --  127* 134*  BUN 16 16  --   --  19 14  CREATININE 0.64 0.48  --   --  0.51 0.46  CALCIUM 9.0 8.8*  --   --  9.1 9.1  MG  --  2.1  --   --  2.2 2.2  PROT 7.3 6.8  --   --   --   --   ALBUMIN 4.3 3.8  --   --   --   --   AST 78* 38  --   --   --   --  ALT 59* 62*  --   --   --   --   ALKPHOS 81 80  --   --   --   --   BILITOT 0.5 1.0  --   --   --   --   GFRNONAA >60 >60  --   --  >60 >60  ANIONGAP 11 11  --   --  9 9   < > = values in this interval not displayed.     Lipids  Recent Labs  Lab 10/13/21 1159  CHOL 216*  TRIG 77  HDL 79  LDLCALC 122*  CHOLHDL 2.7     Hematology Recent Labs  Lab 10/14/21 0611 10/14/21 0806 10/14/21 0820 10/15/21 0302 10/16/21 0325  WBC 10.3  --   --  11.8* 10.2  RBC 5.03  --   --  4.95 5.26*  HGB 15.8*   < > 15.3*  15.3* 15.4* 16.7*  HCT 47.9*   < > 45.0  45.0 47.3* 49.4*  MCV 95.2  --   --  95.6 93.9  MCH 31.4  --   --  31.1 31.7  MCHC 33.0  --   --  32.6 33.8  RDW 13.1  --   --  13.2 12.9  PLT 268  --   --  256 265   < > = values in this interval not displayed.    Thyroid  Recent Labs  Lab 10/13/21 1159  TSH 0.852     BNP Recent Labs  Lab 10/13/21 0627  BNP 54.0     DDimer No results for input(s): DDIMER in the last 168 hours.   Radiology    No results found.  Cardiac Studies     Patient Profile     71 y.o. female with a hx of breast cancer who is being seen for the evaluation of new CHF  Assessment & Plan    Elevated Troponin: hsTn 10>>104>>258. Patient reported chest pain and  dyspnea.  Normal heart arteries on cath.  Suspect demand ischemia in setting of significantly elevated BP   HTN urgency: Initial BP 232/140.  Initially started on nitroglycerin drip has been weaned off.  BP improved -Continue Coreg 12.5 mg twice daily -Continue spironolactone 25 mg daily -Continue Entresto 24-26 mg twice daily   Acute HFrEF: Echo this admission showed global hypokinesis with abnormal septal motion, LVEF 26-83%, grade I diastolic dysfunction. Suspect secondary to uncontrolled HTN.  Normal coronary arteries on cath.  Elevated pressures (RA 9, RV 52/6, PA 44/21/35, PCWP 28, PA sat 77%, CI 3.4) -Elevated filling pressures, started IV Lasix.  Appears euvolemic, will transition to p.o. Lasix 40 mg daily today -Continue Coreg, spironolactone, [place], Entresto.   -Will plan for cardiac MRI as outpatient to work-up NICM   HLD  -LDL 122, HDL 79, triglycerides 77  -Started Lipitor 40 mg daily  [facility] will sign off.   Medication Recommendations:  Coreg 12.5 mg BID, Farxiga 10 mg daily, Entresto 24-26 mg BID, spironolactone 25 mg daily Other recommendations (labs, testing, etc):  BMET within 1 week Follow up as an outpatient:  Will schedule   For questions or updates, please contact [place] Please consult [email or web address] for contact info under        Signed, Donato Heinz, MD  10/16/2021, 8:55 AM

## 2021-10-16 NOTE — Discharge Summary (Signed)
Name: Theresa Hancock MRN: [id number] DOB: 1950/07/31 71 y.o. PCP: Pcp, No  Date of Admission: 10/13/2021  5:53 AM Date of Discharge:  10/16/2021 Attending Physician: Dr.  Cain Sieve  Discharge Diagnosis: Principal Problem:   Hypertensive urgency Active Problems:   Acute systolic heart failure (HCC)   Elevated troponin   Acute respiratory failure with hypoxia [facility])   Demand ischemia [facility])    Discharge Medications: Allergies as of 10/16/2021   No Known Allergies      Medication List     STOP taking these medications    diphenhydrAMINE 25 MG tablet Commonly known as: SOMINEX       TAKE these medications    aspirin EC 81 MG tablet Take 1 tablet (81 mg total) by mouth daily. Swallow whole. Start taking on: Oct 17, 2021   atorvastatin 40 MG tablet Commonly known as: LIPITOR Take 1 tablet (40 mg total) by mouth daily. Start taking on: Oct 17, 2021   carvedilol 12.5 MG tablet Commonly known as: COREG Take 1 tablet (12.5 mg total) by mouth 2 (two) times daily with a meal.   dapagliflozin propanediol 10 MG Tabs tablet Commonly known as: FARXIGA Take 1 tablet (10 mg total) by mouth daily. Start taking on: Oct 17, 2021   furosemide 40 MG tablet Commonly known as: LASIX Take 1 tablet (40 mg total) by mouth daily. Start taking on: Oct 17, 2021   sacubitril-valsartan 24-26 MG Commonly known as: ENTRESTO Take 1 tablet by mouth 2 (two) times daily.   spironolactone 25 MG tablet Commonly known as: ALDACTONE Take 1 tablet (25 mg total) by mouth daily. Start taking on: Oct 17, 2021        Disposition and follow-up:   Ms.Theresa Hancock was discharged from [facility] in Stable condition.  At the hospital follow up visit please address:  1.  Follow-up:  a.  New nonischemic cardiomyopathy: Patient was diuresed 8 lbs with discharge weight of 88.5 kg. She was started on GDMT. Please assess volume status and ensure patient is adherent to her heart  failure regimen. Also, make sure patient has an appointment with cardiology.     b. Severe hypertension: Blood pressure improved from SBP in the 230s to 140s by day of discharge. Check her BP and ensure she is taking her BP medications.    c. Pneumonia: CXR showed findings likely pneumonia vs edema. While the findings were more consistent with edema in the setting of the new heart failure diagnosis, pt received 3 days of azithromycin.   d. Hyperlipidemia: LDL was elevated to 122. Patient was started on Lipitor and aspirin.   2.  Labs / imaging needed at time of follow-up: None  3.  Pending labs/ test needing follow-up: None  Follow-up Appointments:  Follow-up [place]. Go on 11/03/2021.   Specialty: Internal Medicine Why: '@9'$ :40am Contact information: [place] Hospital Course by problem list: Theresa Hancock is a 71 y.o. female with hx of breast cancer in 1970s s/p chemotherapy and bilateral mastectomy presented with acute onset dyspnea, admitted for severe asymptomatic hypertension and found to have acute HFmrEF. S/p LHC with normal coronaries.  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 Left heart   catheterization with  angiographically showed normal coronary arteries. She received IV diuresing due to elevated filling pressures. By day of discharge, patient was down 8 lbs. Kidney function remained stable. Her lower extremity edema significantly improved. She was slowly started on Guideline Directed Medical Therapy (GDMT). She was discharged home on lasix 40 mg daily, Entresto 24-26 mg twice daily, coreg 12.5 mg twice daily, spironolactone 25 mg daily and Farxiga 10 mg daily. Cardiology plan to see patient in the outpatient for possible cardiac MRI to work up NICM.   Severe  Hypertension  Presented with SBP > 230 on admission with pulmonary edema and elevated troponin. She was placed on nitro drip for possible flash pulmonary edema in the setting of severely elevated blood pressure. She was then transitioned to oral BP meds such as spironolactone, coreg and Irbesatan. Her irbesartan was switch to [place] the day before discharge day. SBP was in the 140s at day of discharge.   Troponemia Demand ischemia Patient was also found to have troponin elevation (10>>104>>258). Ekg showed sinus rhythm with LBBB. Cardiology was consulted for evaluation for ischemic work up. They performed a LHC/RHC that showed normal coronary arteries by elevated filling pressures. The elevated troponins were thought to be due to demand ischemia. Patient was started on ASA 81 mg daily.    Pneumonia? CTA chest on admission was negative for PE, but showed possible multifocal PNA vs edema and CXR showed possible acute bronchitis and developing multilobar bilateral bronchopneumonia. Pt had mild leukocytosis 12.6 that downtrended to 10.2 at discharge. Patient received 3 days of azithromycin. She remain afebrile throughout her admission.    Hyperlipidemia Lipid panel on 5/18 showed TC 216, trig 77, HDL 79 and LDL 122. Patient was started on atorvastatin 40 mg.   Subjective: Patient evaluated by the bedside sitting in chair eating breakfast. Patient reports feeling much better. She denies any SOB, abd pain or chest pain. She slept well. Her lower extremity swelling has significantly improved. Patient was counseled on new medications for her heart failure and blood pressure.   Discharge Vitals:   BP (!) 145/80 (BP Location: Right Arm)   Pulse 89   Temp 98 F (36.7 C) (Oral)   Resp 18   Ht '5\' 2"'$  (1.575 m)   Wt 88.5 kg   SpO2 97%   BMI 35.68 kg/m  Discharge Exam:  General: Pleasant, well-appearing sitting in bed. No acute distress. CV: RRR. No murmurs, rubs, or gallops. Trace BLE  edema Pulmonary: Lungs CTAB. Normal effort. No wheezing or rales. Abdominal: Soft, nontender, nondistended. Normal bowel sounds. Extremities: Palpable radial and DP pulses. Normal ROM. Skin: Warm and dry. No obvious rash or lesions. Neuro: A&Ox3. Moves all extremities. Normal sensation.  Psych: Normal mood and affect   Pertinent Labs, Studies, and Procedures:     Latest Ref Rng & Units 10/16/2021    3:25 AM 10/15/2021    3:02 AM 10/14/2021    8:20 AM  CBC  WBC 4.0 - 10.5 K/uL 10.2   11.8     Hemoglobin 12.0 - 15.0 g/dL 16.7   15.4   15.3     15.3    Hematocrit 36.0 - 46.0 % 49.4   47.3   45.0     45.0    Platelets 150 - 400 K/uL 265   256          Latest Ref Rng & Units 10/16/2021    3:25 AM 10/15/2021    3:02 AM 10/14/2021    8:20 AM  CMP  Glucose 70 - 99 mg/dL 134   127     BUN 8 - 23 mg/dL 14   19     Creatinine 0.44 - 1.00 mg/dL 0.46   0.51     Sodium 135 - 145 mmol/L 139   141   145     145    Potassium 3.5 - 5.1 mmol/L 3.8   3.8   3.2     3.2    Chloride 98 - 111 mmol/L 107   109     CO2 22 - 32 mmol/L 23   23     Calcium 8.9 - 10.3 mg/dL 9.1   9.1       CT Angio Chest Pulmonary Embolism (PE) W or WO Contrast  Result Date: 10/13/2021 CLINICAL DATA:  Shortness of breath. EXAM: CT ANGIOGRAPHY CHEST WITH CONTRAST TECHNIQUE: Multidetector CT imaging of the chest was performed using the standard protocol during bolus administration of intravenous contrast. Multiplanar CT image reconstructions and MIPs were obtained to evaluate the vascular anatomy. RADIATION DOSE REDUCTION: This exam was performed according to the departmental dose-optimization program which includes automated exposure control, adjustment of the mA and/or kV according to patient size and/or use of iterative reconstruction technique. CONTRAST:  37m OMNIPAQUE IOHEXOL 350 MG/ML SOLN COMPARISON:  None Available. FINDINGS: Cardiovascular: Satisfactory opacification of the pulmonary arteries to the segmental level.  No evidence of pulmonary embolism. Normal heart size. No pericardial effusion. Mediastinum/Nodes: No enlarged mediastinal, hilar, or axillary lymph nodes. Thyroid gland, trachea, and esophagus demonstrate no significant findings. Lungs/Pleura: Minimal bilateral pleural effusions are noted. No pneumothorax is noted. Bibasilar opacities are noted concerning for edema or possibly multifocal pneumonia. Upper Abdomen: No acute abnormality. Musculoskeletal: No chest wall abnormality. No acute or significant osseous findings. Review of the MIP images confirms the above findings. IMPRESSION: No definite evidence of pulmonary embolus. Bibasilar opacities are noted concerning for edema or possibly multifocal pneumonia. Small pleural effusions are noted. Electronically Signed   By: JMarijo ConceptionM.D.   On: 10/13/2021 08:20   CARDIAC CATHETERIZATION  Result Date: 10/14/2021   Angiographically normal coronary arteries   LV end diastolic pressure is severely elevated.  Insetting of systemic hypertension   Hemodynamic findings consistent with moderate pulmonary hypertension.  PAP mean 35 million mercury   There is no aortic valve stenosis. SUMMARY Angiographically normal coronary arteries -> this would argue against ACS, and more for Hypertensive urgency/emergency Moderate to severely elevated filling pressures with reduced EF, Consistent with Combined Systolic and Diastolic Heart Failure: LVP-EDP 172/13 mmHg - 28 mmHg; AOP-MAP 173/83-123 mmHg. RHC Pressures: Mean RA 9 mmHg, RV-EDP 52/6-19 mmHg; PAP-mean 44/21-35 mmHg; PCWP 28 mmHg; Ao sat 95%, PA sat 77%; Cardiac Output-Index (Fick) 6.61-3.44. Catheter related Torsade De Pointes-(roughly 20 seconds) 1 shock with restoration of ROSC RECOMMENDATIONS Return to the nursing unit for ongoing care. Continue blood pressure control and diuresis-we will give 1 more dose of IV Lasix 2 hours post procedure to allow for post-cath hydration. DGlenetta Hew MD  DG Chest Port 1  View  Result Date: 10/13/2021 CLINICAL DATA:  71year old female with history of shortness of breath. EXAM: PORTABLE CHEST 1 VIEW COMPARISON:  No priors. FINDINGS: Lung volumes are normal. Widespread areas of interstitial prominence, diffuse peribronchial cuffing and ill-defined opacities are noted throughout the lungs bilaterally, most severe throughout the mid to lower lungs. No pleural effusions. No pneumothorax. Pulmonary vasculature does not appearing origin. Heart size is upper limits of normal. Upper mediastinal  contours are within normal limits. Atherosclerotic calcifications in the thoracic aorta. IMPRESSION: 1. The appearance the chest is concerning for acute bronchitis and developing multilobar bilateral bronchopneumonia, as above. 2. Aortic atherosclerosis. Electronically Signed   By: Vinnie Langton M.D.   On: 10/13/2021 06:18   ECHOCARDIOGRAM COMPLETE  Result Date: 10/13/2021    ECHOCARDIOGRAM REPORT   Patient Name:   ISAURA SCHILLER Date of Exam: 10/13/2021 Medical Rec #:  [id number]       Height:       62.0 in Accession #:    [id number]      Weight:       185.0 lb Date of Birth:  Sep 12, 1950      BSA:          1.849 m Patient Age:    33 years        BP:           136/77 mmHg Patient Gender: F               HR:           87 bpm. Exam Location:  Inpatient Procedure: 2D Echo, Color Doppler and Cardiac Doppler Indications:    R06.9 DOE  History:        Patient has no prior history of Echocardiogram examinations.  Sonographer:    Raquel Sarna Senior RDCS Referring Phys: [id number] JULIE MACHEN  Sonographer Comments: Poor parasternal window due to scar tissue interference IMPRESSIONS  1. Global hypokinesis abnormal septal motion . Left ventricular ejection fraction, by estimation, is 40 to 45%. The left ventricle has mildly decreased function. The left ventricle demonstrates global hypokinesis. The left ventricular internal cavity size was mildly dilated. Left ventricular diastolic parameters are consistent  with Grade I diastolic dysfunction (impaired relaxation).  2. Right ventricular systolic function is normal. The right ventricular size is normal.  3. Left atrial size was mildly dilated.  4. The mitral valve is abnormal. Mild mitral valve regurgitation. No evidence of mitral stenosis.  5. The aortic valve is tricuspid. Aortic valve regurgitation is not visualized. No aortic stenosis is present.  6. The inferior vena cava is dilated in size with >50% respiratory variability, suggesting right atrial pressure of 8 mmHg. FINDINGS  Left Ventricle: Global hypokinesis abnormal septal motion. Left ventricular ejection fraction, by estimation, is 40 to 45%. The left ventricle has mildly decreased function. The left ventricle demonstrates global hypokinesis. The left ventricular internal cavity size was mildly dilated. There is no left ventricular hypertrophy. Left ventricular diastolic parameters are consistent with Grade I diastolic dysfunction (impaired relaxation). Right Ventricle: The right ventricular size is normal. No increase in right ventricular wall thickness. Right ventricular systolic function is normal. Left Atrium: Left atrial size was mildly dilated. Right Atrium: Right atrial size was normal in size. Pericardium: There is no evidence of pericardial effusion. Mitral Valve: The mitral valve is abnormal. There is mild thickening of the mitral valve leaflet(s). Mild mitral valve regurgitation. No evidence of mitral valve stenosis. Tricuspid Valve: The tricuspid valve is normal in structure. Tricuspid valve regurgitation is not demonstrated. No evidence of tricuspid stenosis. Aortic Valve: The aortic valve is tricuspid. Aortic valve regurgitation is not visualized. No aortic stenosis is present. Pulmonic Valve: The pulmonic valve was normal in structure. Pulmonic valve regurgitation is not visualized. No evidence of pulmonic stenosis. Aorta: The aortic root is normal in size and structure. Venous: The inferior  vena cava is dilated in size with greater than 50% respiratory variability, suggesting right  atrial pressure of 8 mmHg. IAS/Shunts: No atrial level shunt detected by color flow Doppler.  LEFT VENTRICLE PLAX 2D LVIDd:         4.40 cm   Diastology LVIDs:         3.00 cm   LV e' medial:  5.98 cm/s LV PW:         0.90 cm   LV e' lateral: 7.94 cm/s LV IVS:        0.90 cm LVOT diam:     2.00 cm LV SV:         60 LV SV Index:   32 LVOT Area:     3.14 cm  RIGHT VENTRICLE RV S prime:     12.20 cm/s TAPSE (M-mode): 2.4 cm LEFT ATRIUM             Index        RIGHT ATRIUM           Index LA diam:        4.00 cm 2.16 cm/m   RA Area:     13.80 cm LA Vol (A2C):   43.0 ml 23.25 ml/m  RA Volume:   32.80 ml  17.74 ml/m LA Vol (A4C):   46.3 ml 25.04 ml/m LA Biplane Vol: 47.4 ml 25.63 ml/m  AORTIC VALVE LVOT Vmax:   101.00 cm/s LVOT Vmean:  69.100 cm/s LVOT VTI:    0.190 m  AORTA Ao Root diam: 2.70 cm Ao Asc diam:  3.10 cm  SHUNTS Systemic VTI:  0.19 m Systemic Diam: 2.00 cm Jenkins Rouge MD Electronically signed by Jenkins Rouge MD Signature Date/Time: 10/13/2021/11:46:52 AM    Final      Discharge Instructions: Ms. Dinh, It was a pleasure taking care of you at [facility] were admitted for shortness of breath and severely elevated blood pressure. You were found to have heart failure likely due to your longstanding high blood pressure or your history of alcohol use. Cardiology looked at the arteries of your heart and they look normal. We started you on medications to help manage your heart failure and blood pressure. We are discharging you home now that you are doing better. Please follow the following instructions.  1) Start taking the following medications: Aspirin 81 mg daily Atorvastatin 40 mg daily Carvedilol 12.5 mg twice daily Farxiga 10 mg daily Entresto 24-26 mg twice daily Spironolactone 25 mg daily Lasix 40 mg daily 2) Follow-up with the internal medicine clinic for a hospital follow-up,  someone from the clinic will call you during the week to schedule appointment. 3) Follow-up with cardiology in the outpatient  Take care,  Dr. Linwood Dibbles, MD, MPH  Signed: Lacinda Axon, MD 10/16/2021, 9:44 AM   Pager: [phone]

## 2021-10-16 NOTE — Discharge Instructions (Addendum)
Ms. Baril, It was a pleasure taking care of you at [facility] were admitted for shortness of breath and severely elevated blood pressure. You were found to have heart failure likely due to your longstanding high blood pressure or your history of alcohol use. Cardiology looked at the arteries of your heart and they look normal. We started you on medications to help manage your heart failure and blood pressure. We are discharging you home now that you are doing better. Please follow the following instructions.  1) Start taking the following medications: Aspirin 81 mg daily Atorvastatin 40 mg daily Carvedilol 12.5 mg twice daily Farxiga 10 mg daily Entresto 24-26 mg twice daily Spironolactone 25 mg daily Lasix 40 mg daily 2) Follow-up with the internal medicine clinic for a hospital follow-up, someone from the clinic will call you during the week to schedule appointment. 3) Follow-up with cardiology in the outpatient  Take care,  Dr. Linwood Dibbles, MD, MPH

## 2021-10-17 ENCOUNTER — Telehealth: Payer: Self-pay | Admitting: *Deleted

## 2021-10-17 ENCOUNTER — Encounter (HOSPITAL_COMMUNITY): Payer: Self-pay | Admitting: Cardiology

## 2021-10-17 NOTE — Telephone Encounter (Signed)
Left message to call back to schedule appt with Dr. Gardiner Rhyme

## 2021-11-03 ENCOUNTER — Ambulatory Visit (INDEPENDENT_AMBULATORY_CARE_PROVIDER_SITE_OTHER): Payer: Self-pay | Admitting: Nurse Practitioner

## 2021-11-03 ENCOUNTER — Ambulatory Visit (HOSPITAL_COMMUNITY)
Admission: RE | Admit: 2021-11-03 | Discharge: 2021-11-03 | Disposition: A | Discharge status: Home / Self Care | Payer: Self-pay | Source: Ambulatory Visit | Attending: Nurse Practitioner | Admitting: Nurse Practitioner

## 2021-11-03 ENCOUNTER — Encounter: Payer: Self-pay | Admitting: Nurse Practitioner

## 2021-11-03 VITALS — BP 107/62 | HR 79 | Temp 97.7°F | Ht 62.0 in | Wt 194.2 lb

## 2021-11-03 DIAGNOSIS — J189 Pneumonia, unspecified organism: Secondary | ICD-10-CM | POA: Insufficient documentation

## 2021-11-03 DIAGNOSIS — I428 Other cardiomyopathies: Secondary | ICD-10-CM

## 2021-11-03 DIAGNOSIS — E785 Hyperlipidemia, unspecified: Secondary | ICD-10-CM

## 2021-11-03 DIAGNOSIS — I10 Essential (primary) hypertension: Secondary | ICD-10-CM

## 2021-11-03 HISTORY — DX: Hyperlipidemia, unspecified: E78.5

## 2021-11-03 HISTORY — DX: Essential (primary) hypertension: I10

## 2021-11-03 HISTORY — DX: Other cardiomyopathies: I42.8

## 2021-11-03 NOTE — Progress Notes (Signed)
$'@Patient'h$  ID: Theresa Hancock, female    DOB: 1951-03-09, 71 y.o.   MRN: [id number]  Chief Complaint  Patient presents with   Establish Care    Patient is here today to establish care and discuss her hospital visit. Patient was admitted to the hospital on 10/13/21 for Hypertensive Urgency.    Referring provider: No ref. provider found  Recent significant events:  Hospital admission: 10/13/21  Hospital Course by problem list: Theresa Hancock is a 71 y.o. female with hx of breast cancer in 1970s s/p chemotherapy and bilateral mastectomy presented with acute onset dyspnea, admitted for severe asymptomatic hypertension and found to have acute HFmrEF. S/p LHC with normal coronaries.   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 Left heart   catheterization with angiographically showed normal coronary arteries. She received IV diuresing due to elevated filling pressures. By day of discharge, patient was down 8 lbs. Kidney function remained stable. Her lower extremity edema significantly improved. She was slowly started on Guideline Directed Medical Therapy (GDMT). She was discharged home on lasix 40 mg daily, Entresto 24-26 mg twice daily, coreg 12.5 mg twice daily, spironolactone 25 mg daily and Farxiga 10 mg daily. Cardiology plan to see patient in the outpatient for possible cardiac MRI to work up NICM.   Severe Hypertension  Presented with SBP > 230 on admission with pulmonary edema and elevated troponin. She was placed on nitro drip for possible flash pulmonary edema in the setting of severely elevated blood pressure. She was then transitioned to oral BP meds such as spironolactone, coreg and Irbesatan. Her irbesartan was switch to [place] the day before discharge day. SBP was in the 140s at day of discharge.    Troponemia Demand ischemia Patient was also found to have troponin elevation  (10>>104>>258). Ekg showed sinus rhythm with LBBB. Cardiology was consulted for evaluation for ischemic work up. They performed a LHC/RHC that showed normal coronary arteries by elevated filling pressures. The elevated troponins were thought to be due to demand ischemia. Patient was started on ASA 81 mg daily.    Pneumonia? CTA chest on admission was negative for PE, but showed possible multifocal PNA vs edema and CXR showed possible acute bronchitis and developing multilobar bilateral bronchopneumonia. Pt had mild leukocytosis 12.6 that downtrended to 10.2 at discharge. Patient received 3 days of azithromycin. She remain afebrile throughout her admission.    Hyperlipidemia Lipid panel on 5/18 showed TC 216, trig 77, HDL 79 and LDL 122. Patient was started on atorvastatin 40 mg.   HPI  Patient presents today to establish care.  She was recently admitted to the hospital on 10/14/2018 with acute respiratory failure.  Please see note above.  Issues that need to be addressed for hospital follow-up today are as follows:   New nonischemic cardiomyopathy: Patient was diuresed 8 lbs with discharge weight of 88.5 kg. She was started on GDMT. Please assess volume status and ensure patient is adherent to her heart failure regimen. Also, make sure patient has an appointment with cardiology.   Note: Cardiology scheduled for next week Taking medications as directed Weight stable              Severe hypertension: Blood pressure improved from SBP in the 230s to 140s by day of discharge. Check her BP and ensure she is taking her BP medications.   Note: BP stable today - compliant with medications and low salt diet,  half a glass of wine at night   Pneumonia: CXR showed findings likely pneumonia vs edema. While the findings were more consistent with edema in the setting of the new heart failure diagnosis, pt received 3 days of azithromycin.  Completed course of azithromycin   Hyperlipidemia: LDL was elevated  to 122. Patient was started on Lipitor and aspirin.   Note: taking medications as directed   Doing well after hospital discharge. Endurance is improving.      No Known Allergies  Immunization History  Administered Date(s) Administered   Moderna Covid-19 Vaccine Bivalent Booster 98yr & up 03/10/2021    Past Medical History:  Diagnosis Date   [place]     Tobacco History: Social History   Tobacco Use  Smoking Status Never  Smokeless Tobacco Never   Counseling given: Not Answered   Outpatient Encounter Medications as of 11/03/2021  Medication Sig   aspirin EC 81 MG tablet Take 1 tablet (81 mg total) by mouth daily. Swallow whole.   atorvastatin (LIPITOR) 40 MG tablet Take 1 tablet (40 mg total) by mouth daily.   carvedilol (COREG) 12.5 MG tablet Take 1 tablet (12.5 mg total) by mouth 2 (two) times daily with a meal.   dapagliflozin propanediol (FARXIGA) 10 MG TABS tablet Take 1 tablet (10 mg total) by mouth daily.   furosemide (LASIX) 40 MG tablet Take 1 tablet (40 mg total) by mouth daily.   sacubitril-valsartan (ENTRESTO) 24-26 MG Take 1 tablet by mouth 2 (two) times daily.   spironolactone (ALDACTONE) 25 MG tablet Take 1 tablet (25 mg total) by mouth daily.   COVID-19 mRNA bivalent vaccine, Moderna, (MODERNA COVID-19 BIVAL BOOSTER) 50 MCG/0.5ML injection Inject into the muscle. (Patient not taking: Reported on 11/03/2021)   No facility-administered encounter medications on file as of 11/03/2021.     Review of Systems  Review of Systems  Constitutional: Negative.   HENT: Negative.    Respiratory:  Positive for shortness of breath.   Cardiovascular: Negative.   Gastrointestinal: Negative.   Allergic/Immunologic: Negative.   Neurological: Negative.   Psychiatric/Behavioral: Negative.         Physical Exam  BP 107/62   Pulse 79   Temp 97.7 F (36.5 C)   Ht '5\' 2"'$  (1.575 m)   Wt 194 lb 3.2 oz (88.1 kg)   SpO2 99%   BMI 35.52 kg/m   Wt Readings  from Last 5 Encounters:  11/03/21 194 lb 3.2 oz (88.1 kg)  10/16/21 195 lb 1.6 oz (88.5 kg)   Last Weight  Most recent update: 11/03/2021  9:41 AM    Weight  88.1 kg (194 lb 3.2 oz)              Physical Exam Vitals and nursing note reviewed.  Constitutional:      General: She is not in acute distress.    Appearance: She is well-developed.  Cardiovascular:     Rate and Rhythm: Normal rate and regular rhythm.  Pulmonary:     Effort: Pulmonary effort is normal.     Breath sounds: Normal breath sounds.  Neurological:     Mental Status: She is alert and oriented to person, place, and time.      Lab Results:  CBC    Component Value Date/Time   WBC 10.2 10/16/2021 0325   RBC 5.26 (H) 10/16/2021 0325   HGB 16.7 (H) 10/16/2021 0325   HCT 49.4 (H) 10/16/2021 0325   PLT 265 10/16/2021 0325   MCV  93.9 10/16/2021 0325   MCH 31.7 10/16/2021 0325   MCHC 33.8 10/16/2021 0325   RDW 12.9 10/16/2021 0325   LYMPHSABS 2.3 10/14/2021 0611   MONOABS 0.6 10/14/2021 0611   EOSABS 0.4 10/14/2021 0611   BASOSABS 0.1 10/14/2021 0611    BMET    Component Value Date/Time   NA 139 10/16/2021 0325   K 3.8 10/16/2021 0325   CL 107 10/16/2021 0325   CO2 23 10/16/2021 0325   GLUCOSE 134 (H) 10/16/2021 0325   BUN 14 10/16/2021 0325   CREATININE 0.46 10/16/2021 0325   CALCIUM 9.1 10/16/2021 0325   GFRNONAA >60 10/16/2021 0325    BNP    Component Value Date/Time   BNP 54.0 10/13/2021 0627    ProBNP No results found for: "PROBNP"  Imaging: CARDIAC CATHETERIZATION  Result Date: 10/14/2021   Angiographically normal coronary arteries   LV end diastolic pressure is severely elevated.  Insetting of systemic hypertension   Hemodynamic findings consistent with moderate pulmonary hypertension.  PAP mean 35 million mercury   There is no aortic valve stenosis. SUMMARY Angiographically normal coronary arteries -> this would argue against ACS, and more for Hypertensive urgency/emergency  Moderate to severely elevated filling pressures with reduced EF, Consistent with Combined Systolic and Diastolic Heart Failure: LVP-EDP 172/13 mmHg - 28 mmHg; AOP-MAP 173/83-123 mmHg. RHC Pressures: Mean RA 9 mmHg, RV-EDP 52/6-19 mmHg; PAP-mean 44/21-35 mmHg; PCWP 28 mmHg; Ao sat 95%, PA sat 77%; Cardiac Output-Index (Fick) 6.61-3.44. Catheter related Tors[place] De Pointes-(roughly 20 seconds) 1 shock with restoration of ROSC RECOMMENDATIONS Return to the nursing unit for ongoing care. Continue blood pressure control and diuresis-we will give 1 more dose of IV Lasix 2 hours post procedure to allow for post-cath hydration. Glenetta Hew, MD  ECHOCARDIOGRAM COMPLETE  Result Date: 10/13/2021    ECHOCARDIOGRAM REPORT   Patient Name:   ALGA SOUTHALL Date of Exam: 10/13/2021 Medical Rec #:  [id number]       Height:       62.0 in Accession #:    [id number]      Weight:       185.0 lb Date of Birth:  [birth year]      BSA:          1.849 m Patient Age:    3 years        BP:           136/77 mmHg Patient Gender: F               HR:           87 bpm. Exam Location:  Inpatient Procedure: 2D Echo, Color Doppler and Cardiac Doppler Indications:    R06.9 DOE  History:        Patient has no prior history of Echocardiogram examinations.  Sonographer:    Raquel Sarna Senior RDCS Referring Phys: [id number] JULIE MACHEN  Sonographer Comments: Poor parasternal window due to scar tissue interference IMPRESSIONS  1. Global hypokinesis abnormal septal motion . Left ventricular ejection fraction, by estimation, is 40 to 45%. The left ventricle has mildly decreased function. The left ventricle demonstrates global hypokinesis. The left ventricular internal cavity size was mildly dilated. Left ventricular diastolic parameters are consistent with Grade I diastolic dysfunction (impaired relaxation).  2. Right ventricular systolic function is normal. The right ventricular size is normal.  3. Left atrial size was mildly dilated.  4. The mitral valve is  abnormal. Mild mitral valve regurgitation. No evidence of mitral stenosis.  5.  The aortic valve is tricuspid. Aortic valve regurgitation is not visualized. No aortic stenosis is present.  6. The inferior vena cava is dilated in size with >50% respiratory variability, suggesting right atrial pressure of 8 mmHg. FINDINGS  Left Ventricle: Global hypokinesis abnormal septal motion. Left ventricular ejection fraction, by estimation, is 40 to 45%. The left ventricle has mildly decreased function. The left ventricle demonstrates global hypokinesis. The left ventricular internal cavity size was mildly dilated. There is no left ventricular hypertrophy. Left ventricular diastolic parameters are consistent with Grade I diastolic dysfunction (impaired relaxation). Right Ventricle: The right ventricular size is normal. No increase in right ventricular wall thickness. Right ventricular systolic function is normal. Left Atrium: Left atrial size was mildly dilated. Right Atrium: Right atrial size was normal in size. Pericardium: There is no evidence of pericardial effusion. Mitral Valve: The mitral valve is abnormal. There is mild thickening of the mitral valve leaflet(s). Mild mitral valve regurgitation. No evidence of mitral valve stenosis. Tricuspid Valve: The tricuspid valve is normal in structure. Tricuspid valve regurgitation is not demonstrated. No evidence of tricuspid stenosis. Aortic Valve: The aortic valve is tricuspid. Aortic valve regurgitation is not visualized. No aortic stenosis is present. Pulmonic Valve: The pulmonic valve was normal in structure. Pulmonic valve regurgitation is not visualized. No evidence of pulmonic stenosis. Aorta: The aortic root is normal in size and structure. Venous: The inferior vena cava is dilated in size with greater than 50% respiratory variability, suggesting right atrial pressure of 8 mmHg. IAS/Shunts: No atrial level shunt detected by color flow Doppler.  LEFT VENTRICLE PLAX 2D  LVIDd:         4.40 cm   Diastology LVIDs:         3.00 cm   LV e' medial:  5.98 cm/s LV PW:         0.90 cm   LV e' lateral: 7.94 cm/s LV IVS:        0.90 cm LVOT diam:     2.00 cm LV SV:         60 LV SV Index:   32 LVOT Area:     3.14 cm  RIGHT VENTRICLE RV S prime:     12.20 cm/s TAPSE (M-mode): 2.4 cm LEFT ATRIUM             Index        RIGHT ATRIUM           Index LA diam:        4.00 cm 2.16 cm/m   RA Area:     13.80 cm LA Vol (A2C):   43.0 ml 23.25 ml/m  RA Volume:   32.80 ml  17.74 ml/m LA Vol (A4C):   46.3 ml 25.04 ml/m LA Biplane Vol: 47.4 ml 25.63 ml/m  AORTIC VALVE LVOT Vmax:   101.00 cm/s LVOT Vmean:  69.100 cm/s LVOT VTI:    0.190 m  AORTA Ao Root diam: 2.70 cm Ao Asc diam:  3.10 cm  SHUNTS Systemic VTI:  0.19 m Systemic Diam: 2.00 cm Jenkins Rouge MD Electronically signed by Jenkins Rouge MD Signature Date/Time: 10/13/2021/11:46:52 AM    Final    CT Angio Chest Pulmonary Embolism (PE) W or WO Contrast  Result Date: 10/13/2021 CLINICAL DATA:  Shortness of breath. EXAM: CT ANGIOGRAPHY CHEST WITH CONTRAST TECHNIQUE: Multidetector CT imaging of the chest was performed using the standard protocol during bolus administration of intravenous contrast. Multiplanar CT image reconstructions and MIPs were obtained to evaluate  the vascular anatomy. RADIATION DOSE REDUCTION: This exam was performed according to the departmental dose-optimization program which includes automated exposure control, adjustment of the mA and/or kV according to patient size and/or use of iterative reconstruction technique. CONTRAST:  72m OMNIPAQUE IOHEXOL 350 MG/ML SOLN COMPARISON:  None Available. FINDINGS: Cardiovascular: Satisfactory opacification of the pulmonary arteries to the segmental level. No evidence of pulmonary embolism. Normal heart size. No pericardial effusion. Mediastinum/Nodes: No enlarged mediastinal, hilar, or axillary lymph nodes. Thyroid gland, trachea, and esophagus demonstrate no significant findings.  Lungs/Pleura: Minimal bilateral pleural effusions are noted. No pneumothorax is noted. Bibasilar opacities are noted concerning for edema or possibly multifocal pneumonia. Upper Abdomen: No acute abnormality. Musculoskeletal: No chest wall abnormality. No acute or significant osseous findings. Review of the MIP images confirms the above findings. IMPRESSION: No definite evidence of pulmonary embolus. Bibasilar opacities are noted concerning for edema or possibly multifocal pneumonia. Small pleural effusions are noted. Electronically Signed   By: JMarijo ConceptionM.D.   On: 10/13/2021 08:20   DG Chest Port 1 View  Result Date: 10/13/2021 CLINICAL DATA:  71year old female with history of shortness of breath. EXAM: PORTABLE CHEST 1 VIEW COMPARISON:  No priors. FINDINGS: Lung volumes are normal. Widespread areas of interstitial prominence, diffuse peribronchial cuffing and ill-defined opacities are noted throughout the lungs bilaterally, most severe throughout the mid to lower lungs. No pleural effusions. No pneumothorax. Pulmonary vasculature does not appearing origin. Heart size is upper limits of normal. Upper mediastinal contours are within normal limits. Atherosclerotic calcifications in the thoracic aorta. IMPRESSION: 1. The appearance the chest is concerning for acute bronchitis and developing multilobar bilateral bronchopneumonia, as above. 2. Aortic atherosclerosis. Electronically Signed   By: DVinnie LangtonM.D.   On: 10/13/2021 06:18     Assessment & Plan:   [place]mic [place] Current Outpatient Medications on File Prior to Visit  Medication Sig Dispense Refill   aspirin EC 81 MG tablet Take 1 tablet (81 mg total) by mouth daily. Swallow whole. 30 tablet 12   atorvastatin (LIPITOR) 40 MG tablet Take 1 tablet (40 mg total) by mouth daily. 30 tablet 5   carvedilol (COREG) 12.5 MG tablet Take 1 tablet (12.5 mg total) by mouth 2 (two) times daily with a meal. 60 tablet 5    dapagliflozin propanediol (FARXIGA) 10 MG TABS tablet Take 1 tablet (10 mg total) by mouth daily. 30 tablet 5   furosemide (LASIX) 40 MG tablet Take 1 tablet (40 mg total) by mouth daily. 30 tablet 5   sacubitril-valsartan (ENTRESTO) 24-26 MG Take 1 tablet by mouth 2 (two) times daily. 60 tablet 5   spironolactone (ALDACTONE) 25 MG tablet Take 1 tablet (25 mg total) by mouth daily. 30 tablet 5   COVID-19 mRNA bivalent vaccine, Moderna, (MODERNA COVID-19 BIVAL BOOSTER) 50 MCG/0.5ML injection Inject into the muscle. (Patient not taking: Reported on 11/03/2021) 0.5 mL 0   No current facility-administered medications on file prior to visit.      TFenton Foy NP 11/03/2021

## 2021-11-03 NOTE — Patient Instructions (Signed)
1. Non-ischemic cardiomyopathy ([place])   2. Primary hypertension   3. Pneumonia of both lungs due to infectious organism, unspecified part of lung  - DG Chest 2 View  4. Hyperlipidemia, unspecified hyperlipidemia type  Continue current medications:  Current Outpatient Medications on File Prior to Visit  Medication Sig Dispense Refill   aspirin EC 81 MG tablet Take 1 tablet (81 mg total) by mouth daily. Swallow whole. 30 tablet 12   atorvastatin (LIPITOR) 40 MG tablet Take 1 tablet (40 mg total) by mouth daily. 30 tablet 5   carvedilol (COREG) 12.5 MG tablet Take 1 tablet (12.5 mg total) by mouth 2 (two) times daily with a meal. 60 tablet 5   dapagliflozin propanediol (FARXIGA) 10 MG TABS tablet Take 1 tablet (10 mg total) by mouth daily. 30 tablet 5   furosemide (LASIX) 40 MG tablet Take 1 tablet (40 mg total) by mouth daily. 30 tablet 5   sacubitril-valsartan (ENTRESTO) 24-26 MG Take 1 tablet by mouth 2 (two) times daily. 60 tablet 5   spironolactone (ALDACTONE) 25 MG tablet Take 1 tablet (25 mg total) by mouth daily. 30 tablet 5   COVID-19 mRNA bivalent vaccine, Moderna, (MODERNA COVID-19 BIVAL BOOSTER) 50 MCG/0.5ML injection Inject into the muscle. (Patient not taking: Reported on 11/03/2021) 0.5 mL 0   No current facility-administered medications on file prior to visit.   Keep follow up with cardiology next week  Stay active  May wear compression hose  Follow up:  Follow up in 3 months or sooner if needed

## 2021-11-03 NOTE — Assessment & Plan Note (Signed)
Current Outpatient Medications on File Prior to Visit  Medication Sig Dispense Refill  . aspirin EC 81 MG tablet Take 1 tablet (81 mg total) by mouth daily. Swallow whole. 30 tablet 12  . atorvastatin (LIPITOR) 40 MG tablet Take 1 tablet (40 mg total) by mouth daily. 30 tablet 5  . carvedilol (COREG) 12.5 MG tablet Take 1 tablet (12.5 mg total) by mouth 2 (two) times daily with a meal. 60 tablet 5  . dapagliflozin propanediol (FARXIGA) 10 MG TABS tablet Take 1 tablet (10 mg total) by mouth daily. 30 tablet 5  . furosemide (LASIX) 40 MG tablet Take 1 tablet (40 mg total) by mouth daily. 30 tablet 5  . sacubitril-valsartan (ENTRESTO) 24-26 MG Take 1 tablet by mouth 2 (two) times daily. 60 tablet 5  . spironolactone (ALDACTONE) 25 MG tablet Take 1 tablet (25 mg total) by mouth daily. 30 tablet 5  . COVID-19 mRNA bivalent vaccine, Moderna, (MODERNA COVID-19 BIVAL BOOSTER) 50 MCG/0.5ML injection Inject into the muscle. (Patient not taking: Reported on 11/03/2021) 0.5 mL 0   No current facility-administered medications on file prior to visit.

## 2021-11-07 ENCOUNTER — Telehealth: Payer: Self-pay | Admitting: Nurse Practitioner

## 2021-11-07 NOTE — Telephone Encounter (Signed)
Patient returned call requesting results from chest x-ray. States her VM is secure. [phone]

## 2021-11-07 NOTE — Progress Notes (Signed)
Cardiology Clinic Note   Patient Name: Theresa Hancock Date of Encounter: 11/09/2021  Primary Care Provider:  Fenton Foy, NP Primary Cardiologist:  Donato Heinz, MD  Patient Profile    Theresa Hancock 71 year old female presents to clinic today for follow-up evaluation of her nonischemic cardiomyopathy.  Past Medical History    Past Medical History:  Diagnosis Date   Breast cancer [facility])    Past Surgical History:  Procedure Laterality Date   BILATERAL TOTAL MASTECTOMY WITH AXILLARY LYMPH NODE DISSECTION Bilateral 1999   LAPAROSCOPIC TOTAL HYSTERECTOMY Bilateral 1999   RIGHT/LEFT HEART CATH AND CORONARY ANGIOGRAPHY N/A 10/14/2021   Procedure: RIGHT/LEFT HEART CATH AND CORONARY ANGIOGRAPHY;  Surgeon: Leonie Man, MD;  Location: [facility];  Service: Cardiovascular;  Laterality: N/A;    Allergies  No Known Allergies  History of Present Illness    [name] is a PMH of breast CA and HFrEF.  Her PMH also includes hypertension, elevated troponins, and hyperlipidemia.  She was admitted to the hospital on 10/13/2021 and discharged on 10/16/2021.  Cardiology was consulted due to elevated troponins and new CHF.  Her high-sensitivity troponins went from 10-104-258.  She underwent LHC which showed normal coronary arteries.  She was felt to have demand ischemia in the setting of elevated blood pressure.  Her blood pressure was 230s over 140s.  She was initiated on nitroglycerin and weaned off.  She was transitioned onto carvedilol, spironolactone, and Entresto.  Her echocardiogram showed a LVEF of 40-45%, G1 DD which was felt to be caused by her elevated blood pressure.  She was initiated on furosemide IV and transition to p.o. furosemide.  Wilder Glade was also added to her medication regimen.  Plan for cardiac MRI as an outpatient for evaluation of nonischemic cardiomyopathy.  She presents to the clinic today for follow-up evaluation states she feels well.  She  has started to monitor her diet much more closely.  She is avoiding high salt foods.  She has started to slowly increase her physical activity.  We reviewed her hospitalization she expressed understanding.  We reviewed her cardiac catheterization.  We did discuss titrating medication today.  However, she did not take her medications prior to her visit and she has not been monitoring her blood pressure at home.  I will defer medication titration at this time due to not knowing what her blood pressure is while on her current medication regimen.  I will give her a blood pressure log, have her continue to increase her physical activity, order cardiac MRI BMP, CBC and plan follow-up after cardiac MRI.  Today she denies chest pain, shortness of breath, lower extremity edema, fatigue, palpitations, melena, hematuria, hemoptysis, diaphoresis, weakness, presyncope, syncope, orthopnea, and PND.   Home Medications    Prior to Admission medications   Medication Sig Start Date End Date Taking? Authorizing Provider  aspirin EC 81 MG tablet Take 1 tablet (81 mg total) by mouth daily. Swallow whole. 10/17/21   Lacinda Axon, MD  atorvastatin (LIPITOR) 40 MG tablet Take 1 tablet (40 mg total) by mouth daily. 10/17/21   Lacinda Axon, MD  carvedilol (COREG) 12.5 MG tablet Take 1 tablet (12.5 mg total) by mouth 2 (two) times daily with a meal. 10/16/21   Amponsah, Charisse March, MD  COVID-19 mRNA bivalent vaccine, Moderna, (MODERNA COVID-19 BIVAL BOOSTER) 50 MCG/0.5ML injection Inject into the muscle. Patient not taking: Reported on 11/03/2021 03/10/21     dapagliflozin propanediol (FARXIGA)  10 MG TABS tablet Take 1 tablet (10 mg total) by mouth daily. 10/17/21   Lacinda Axon, MD  furosemide (LASIX) 40 MG tablet Take 1 tablet (40 mg total) by mouth daily. 10/17/21   Lacinda Axon, MD  sacubitril-valsartan (ENTRESTO) 24-26 MG Take 1 tablet by mouth 2 (two) times daily. 10/16/21   Lacinda Axon, MD   spironolactone (ALDACTONE) 25 MG tablet Take 1 tablet (25 mg total) by mouth daily. 10/17/21   Lacinda Axon, MD    Family History    Family History  Problem Relation Age of Onset   Breast cancer Mother    Heart attack Father    Heart failure Brother    She indicated that her mother is alive. She indicated that her father is deceased. She indicated that her brother is deceased.  Social History    Social History   Socioeconomic History   Marital status: Single    Spouse name: Not on file   Number of children: Not on file   Years of education: Not on file   Highest education level: Not on file  Occupational History   Not on file  Tobacco Use   Smoking status: Never   Smokeless tobacco: Never  Vaping Use   Vaping Use: Never used  Substance and Sexual Activity   Alcohol use: Yes    Alcohol/week: 1.0 standard drink of alcohol    Types: 1 Glasses of wine per week    Comment: 1/2 every to to 3 days   Drug use: Never   Sexual activity: Not Currently  Other Topics Concern   Not on file  Social History Narrative   Not on file   Social Determinants of Health   Financial Resource Strain: Not on file  Food Insecurity: Not on file  Transportation Needs: Not on file  Physical Activity: Not on file  Stress: Not on file  Social Connections: Not on file  Intimate Partner Violence: Not on file     Review of Systems    General:  No chills, fever, night sweats or weight changes.  Cardiovascular:  No chest pain, dyspnea on exertion, edema, orthopnea, palpitations, paroxysmal nocturnal dyspnea. Dermatological: No rash, lesions/masses Respiratory: No cough, dyspnea Urologic: No hematuria, dysuria Abdominal:   No nausea, vomiting, diarrhea, bright red blood per rectum, melena, or hematemesis Neurologic:  No visual changes, wkns, changes in mental status. All other systems reviewed and are otherwise negative except as noted above.  Physical Exam    VS:  BP 128/62    Pulse 78   Ht '5\' 2"'$  (1.575 m)   Wt 198 lb 6.4 oz (90 kg)   SpO2 97%   BMI 36.29 kg/m  , BMI Body mass index is 36.29 kg/m. GEN: Well nourished, well developed, in no acute distress. HEENT: normal. Neck: Supple, no JVD, carotid bruits, or masses. Cardiac: RRR, no murmurs, rubs, or gallops. No clubbing, cyanosis, edema.  Radials/DP/PT 2+ and equal bilaterally.  Respiratory:  Respirations regular and unlabored, clear to auscultation bilaterally. GI: Soft, nontender, nondistended, BS + x 4. MS: no deformity or atrophy. Skin: warm and dry, no rash. Neuro:  Strength and sensation are intact. Psych: Normal affect.  Accessory Clinical Findings    Recent Labs: 10/13/2021: B Natriuretic Peptide 54.0; TSH 0.852 10/14/2021: ALT 62 10/16/2021: BUN 14; Creatinine, Ser 0.46; Hemoglobin 16.7; Magnesium 2.2; Platelets 265; Potassium 3.8; Sodium 139   Recent Lipid Panel    Component Value Date/Time   CHOL 216 (H)  10/13/2021 1159   TRIG 77 10/13/2021 1159   HDL 79 10/13/2021 1159   CHOLHDL 2.7 10/13/2021 1159   VLDL 15 10/13/2021 1159   LDLCALC 122 (H) 10/13/2021 1159    ECG personally reviewed by me today-none today.  Cardiac catheterization 10/14/2021   Angiographically normal coronary arteries   LV end diastolic pressure is severely elevated.  Insetting of systemic hypertension   Hemodynamic findings consistent with moderate pulmonary hypertension.  PAP mean 35 million mercury   There is no aortic valve stenosis.   SUMMARY Angiographically normal coronary arteries -> this would argue against ACS, and more for Hypertensive urgency/emergency Moderate to severely elevated filling pressures with reduced EF, Consistent with Combined Systolic and Diastolic Heart Failure: LVP-EDP 172/13 mmHg - 28 mmHg; AOP-MAP 173/83-123 mmHg. RHC Pressures: Mean RA 9 mmHg, RV-EDP 52/6-19 mmHg; PAP-mean 44/21-35 mmHg; PCWP 28 mmHg;  Ao sat 95%, PA sat 77%; Cardiac Output-Index (Fick) 6.61-3.44. Catheter related  Torsade De Pointes-(roughly 20 seconds) 1 shock with restoration of ROSC     RECOMMENDATIONS Return to the nursing unit for ongoing care. Continue blood pressure control and diuresis-we will give 1 more dose of IV Lasix 2 hours post procedure to allow for post-cath hydration.     Glenetta Hew, MD  Echocardiogram 10/13/2021  IMPRESSIONS     1. Global hypokinesis abnormal septal motion . Left ventricular ejection  fraction, by estimation, is 40 to 45%. The left ventricle has mildly  decreased function. The left ventricle demonstrates global hypokinesis.  The left ventricular internal cavity  size was mildly dilated. Left ventricular diastolic parameters are  consistent with Grade I diastolic dysfunction (impaired relaxation).   2. Right ventricular systolic function is normal. The right ventricular  size is normal.   3. Left atrial size was mildly dilated.   4. The mitral valve is abnormal. Mild mitral valve regurgitation. No  evidence of mitral stenosis.   5. The aortic valve is tricuspid. Aortic valve regurgitation is not  visualized. No aortic stenosis is present.   6. The inferior vena cava is dilated in size with >50% respiratory  variability, suggesting right atrial pressure of 8 mmHg.   Assessment & Plan   1.  Acute HFrEF-no increased DOE or activity intolerance.  Echocardiogram showed an LVEF of 40-45% and G1 DD.  It was felt that her HFrEF was caused by uncontrolled hypertension.  Cardiac MRI recommended.  Outpatient cardiac MRI recommended for evaluation of NICM. Continue furosemide, spironolactone, carvedilol, procedure, Entresto  Heart healthy low-sodium diet-salty 6 given Increase physical activity as tolerated Order cardiac MRI Order BMP, CBC  Essential hypertension-BP today 128/62.  Has not yet taken her blood pressure medications this morning.  Has not been checking her blood pressure at home. Continue carvedilol, spironolactone, Entresto, furosemide Heart  healthy low-sodium diet-salty 6 given Increase physical activity as tolerated  Elevated troponins-no further episodes of chest discomfort.  Underwent cardiac catheterization which showed normal coronary anatomy.  Elevated troponin was felt to be caused by demand ischemia.  Hyperlipidemia-LDL 122 Continue atorvastatin Heart healthy low-sodium high-fiber diet Increase physical activity as tolerated Repeat fasting lipids and LFTs in  6 -8 weeks.  Disposition: Follow-up with Dr. Gardiner Rhyme after cardiac MRI.  Jossie Ng. Jaline Pincock NP-C    11/09/2021, 9:59 AM [address] Office [phone] Fax [phone]  Notice: This dictation was prepared with Dragon dictation along with smaller phrase technology. Any transcriptional errors that result from this process are unintentional and may  not be corrected upon review.  I spent 14 minutes examining this patient, reviewing medications, and using patient centered shared decision making involving her cardiac care.  Prior to her visit I spent greater than 20 minutes reviewing her past medical history,  medications, and prior cardiac tests.

## 2021-11-09 ENCOUNTER — Ambulatory Visit (INDEPENDENT_AMBULATORY_CARE_PROVIDER_SITE_OTHER): Payer: Self-pay | Admitting: General Practice

## 2021-11-09 ENCOUNTER — Encounter: Payer: Self-pay | Admitting: General Practice

## 2021-11-09 VITALS — BP 128/62 | HR 78 | Ht 62.0 in | Wt 198.4 lb

## 2021-11-09 DIAGNOSIS — E782 Mixed hyperlipidemia: Secondary | ICD-10-CM

## 2021-11-09 DIAGNOSIS — I10 Essential (primary) hypertension: Secondary | ICD-10-CM

## 2021-11-09 DIAGNOSIS — R778 Other specified abnormalities of plasma proteins: Secondary | ICD-10-CM

## 2021-11-09 DIAGNOSIS — I5021 Acute systolic (congestive) heart failure: Secondary | ICD-10-CM

## 2021-11-09 LAB — CBC
Hematocrit: 43.4 % (ref 34.0–46.6)
Hemoglobin: 15.4 g/dL (ref 11.1–15.9)
MCH: 32.6 pg (ref 26.6–33.0)
MCHC: 35.5 g/dL (ref 31.5–35.7)
MCV: 92 fL (ref 79–97)
Platelets: 221 10*3/uL (ref 150–450)
RBC: 4.73 x10E6/uL (ref 3.77–5.28)
RDW: 11.6 % — ABNORMAL LOW (ref 11.7–15.4)
WBC: 7.9 10*3/uL (ref 3.4–10.8)

## 2021-11-09 MED ORDER — CARVEDILOL 12.5 MG PO TABS: 12.5000 mg | ORAL_TABLET | Freq: Two times a day (BID) | ORAL | 5 refills | Status: DC

## 2021-11-09 MED ORDER — SACUBITRIL-VALSARTAN 24-26 MG PO TABS: 1.0000 | ORAL_TABLET | Freq: Two times a day (BID) | ORAL | 5 refills | Status: DC

## 2021-11-09 MED ORDER — SPIRONOLACTONE 25 MG PO TABS: 25.0000 mg | ORAL_TABLET | Freq: Every day | ORAL | 5 refills | Status: DC

## 2021-11-09 MED ORDER — ATORVASTATIN CALCIUM 40 MG PO TABS: 40.0000 mg | ORAL_TABLET | Freq: Every day | ORAL | 5 refills | Status: DC

## 2021-11-09 NOTE — Patient Instructions (Signed)
Medication Instructions:  The current medical regimen is effective;  continue present plan and medications as directed. Please refer to the Current Medication list given to you today.   *If you need a refill on your cardiac medications before your next appointment, please call your pharmacy*  Lab Work:      CBC TODAY AND BMET IN 6 WEEKS  If you have labs (blood work) drawn today and your tests are completely normal, you will receive your results only by: [facility] (if you have MyChart) OR  A paper copy in the mail If you have any lab test that is abnormal or we need to change your treatment, we will call you to review the results.  Testing/Procedures:  CARDIAC MRI-Magnetic Resonance Imaging (MRI) is a non-invasive imaging technology that produces three dimensional detailed anatomical images.  Special Instructions TAKE AND LOG YOU BLOOD PRESSURE AT LEAST 1 HOUR AFTER TAKING YOUR MEDICATION  READ AND FOLLOW INCREASED FIBER DIET  Follow-Up: Your next appointment:  AFTER MRI  In Person with Donato Heinz, MD    At [facility], you and your health needs are our priority.  As part of our continuing mission to provide you with exceptional heart care, we have created designated Provider Care Teams.  These Care Teams include your primary Cardiologist (physician) and Advanced Practice Providers (APPs -  Physician Assistants and Nurse Practitioners) who all work together to provide you with the care you need, when you need it.  We recommend signing up for the patient portal called "MyChart".  Sign up information is provided on this After Visit Summary.  MyChart is used to connect with patients for Virtual Visits (Telemedicine).  Patients are able to view lab/test results, encounter notes, upcoming appointments, etc.  Non-urgent messages can be sent to your provider as well.   To learn more about what you can do with MyChart, go to [email or web address].      Important  Information About Sugar

## 2022-01-31 ENCOUNTER — Other Ambulatory Visit (HOSPITAL_BASED_OUTPATIENT_CLINIC_OR_DEPARTMENT_OTHER): Payer: Self-pay

## 2022-02-01 ENCOUNTER — Other Ambulatory Visit (HOSPITAL_BASED_OUTPATIENT_CLINIC_OR_DEPARTMENT_OTHER): Payer: Self-pay

## 2022-02-01 MED ORDER — AREXVY 120 MCG/0.5ML IM SUSR
INTRAMUSCULAR | 0 refills | Status: DC
  Filled 2022-02-01: qty 0.5, 1d supply, fill #0

## 2022-02-03 ENCOUNTER — Encounter: Payer: Self-pay | Admitting: Nurse Practitioner

## 2022-02-03 ENCOUNTER — Ambulatory Visit (INDEPENDENT_AMBULATORY_CARE_PROVIDER_SITE_OTHER): Payer: Self-pay | Admitting: Nurse Practitioner

## 2022-02-03 VITALS — BP 127/65 | HR 70 | Temp 97.9°F | Ht 62.0 in | Wt 201.6 lb

## 2022-02-03 DIAGNOSIS — I10 Essential (primary) hypertension: Secondary | ICD-10-CM

## 2022-02-03 NOTE — Patient Instructions (Addendum)
1. Primary hypertension  - CBC - Comprehensive metabolic panel   Follow up:  Follow up in 3 months

## 2022-02-03 NOTE — Progress Notes (Signed)
$'@Patient'V$  ID: Theresa Hancock, female    DOB: 05-06-1951, 71 y.o.   MRN: [id number]  Chief Complaint  Patient presents with   Follow-up    Pt is here for 3 follow visit. Pt states she has lightheadedness and dizziness for the past 3 weeks.    Referring provider: Fenton Foy, NP   HPI  71 year old female with history of acute systolic heart failure, demand ischemia, cardiomyopathy, hypertension, pneumonia, breast cancer, hyperlipidemia.  Patient presents for a follow up today.  She states that she has been doing well.  She states that she is back to normal after her recent hospitalization.  She is following low-salt diet.  Patient did recently have RSV vaccine.  She is scheduled to have flu and COVID-vaccine constitutional".  She has follow-up with cardiology since her last visit here.  She is compliant with medications.  She states that she has had a couple dizzy spells over the past few weeks.  We discussed that she should stay well-hydrated and be careful with the heat.  We will check blood work today. Denies f/c/s, n/v/d, hemoptysis, PND, leg swelling Denies chest pain or edema    No Known Allergies  Immunization History  Administered Date(s) Administered   Moderna Covid-19 Vaccine Bivalent Booster 72yr & up 03/10/2021   Respiratory Syncytial Virus Vaccine,Recomb Aduvanted(Arexvy) 02/01/2022    Past Medical History:  Diagnosis Date   Breast cancer ([facility]     Tobacco History: Social History   Tobacco Use  Smoking Status Never  Smokeless Tobacco Never   Counseling given: Not Answered   Outpatient Encounter Medications as of 02/03/2022  Medication Sig   aspirin EC 81 MG tablet Take 1 tablet (81 mg total) by mouth daily. Swallow whole.   atorvastatin (LIPITOR) 40 MG tablet Take 1 tablet (40 mg total) by mouth daily.   carvedilol (COREG) 12.5 MG tablet Take 1 tablet (12.5 mg total) by mouth 2 (two) times daily with a meal.   dapagliflozin propanediol (FARXIGA) 10 MG  TABS tablet Take 1 tablet (10 mg total) by mouth daily.   furosemide (LASIX) 40 MG tablet Take 1 tablet (40 mg total) by mouth daily.   sacubitril-valsartan (ENTRESTO) 24-26 MG Take 1 tablet by mouth 2 (two) times daily.   spironolactone (ALDACTONE) 25 MG tablet Take 1 tablet (25 mg total) by mouth daily.   COVID-19 mRNA bivalent vaccine, Moderna, (MODERNA COVID-19 BIVAL BOOSTER) 50 MCG/0.5ML injection Inject into the muscle.   RSV vaccine recomb adjuvanted (AREXVY) 120 MCG/0.5ML injection Inject into the muscle. (Patient not taking: Reported on 02/03/2022)   No facility-administered encounter medications on file as of 02/03/2022.     Review of Systems  Review of Systems  Constitutional: Negative.   HENT: Negative.    Cardiovascular: Negative.   Gastrointestinal: Negative.   Allergic/Immunologic: Negative.   Neurological: Negative.   Psychiatric/Behavioral: Negative.         Physical Exam  BP 127/65 (BP Location: Left Arm, Patient Position: Sitting, Cuff Size: Large)   Pulse 70   Temp 97.9 F (36.6 C)   Ht '5\' 2"'$  (1.575 m)   Wt 201 lb 9.6 oz (91.4 kg)   SpO2 100%   BMI 36.87 kg/m   Wt Readings from Last 5 Encounters:  02/03/22 201 lb 9.6 oz (91.4 kg)  11/09/21 198 lb 6.4 oz (90 kg)  11/03/21 194 lb 3.2 oz (88.1 kg)  10/16/21 195 lb 1.6 oz (88.5 kg)     Physical Exam Vitals and  nursing note reviewed.  Constitutional:      General: She is not in acute distress.    Appearance: She is well-developed.  Cardiovascular:     Rate and Rhythm: Normal rate and regular rhythm.  Pulmonary:     Effort: Pulmonary effort is normal.     Breath sounds: Normal breath sounds.  Neurological:     Mental Status: She is alert and oriented to person, place, and time.      Lab Results:  CBC    Component Value Date/Time   WBC 7.9 11/09/2021 1032   WBC 10.2 10/16/2021 0325   RBC 4.73 11/09/2021 1032   RBC 5.26 (H) 10/16/2021 0325   HGB 15.4 11/09/2021 1032   HCT 43.4 11/09/2021  1032   PLT 221 11/09/2021 1032   MCV 92 11/09/2021 1032   MCH 32.6 11/09/2021 1032   MCH 31.7 10/16/2021 0325   MCHC 35.5 11/09/2021 1032   MCHC 33.8 10/16/2021 0325   RDW 11.6 (L) 11/09/2021 1032   LYMPHSABS 2.3 10/14/2021 0611   MONOABS 0.6 10/14/2021 0611   EOSABS 0.4 10/14/2021 0611   BASOSABS 0.1 10/14/2021 0611    BMET    Component Value Date/Time   NA 139 10/16/2021 0325   K 3.8 10/16/2021 0325   CL 107 10/16/2021 0325   CO2 23 10/16/2021 0325   GLUCOSE 134 (H) 10/16/2021 0325   BUN 14 10/16/2021 0325   CREATININE 0.46 10/16/2021 0325   CALCIUM 9.1 10/16/2021 0325   GFRNONAA >60 10/16/2021 0325    BNP    Component Value Date/Time   BNP 54.0 10/13/2021 0627    ProBNP No results found for: "PROBNP"  Imaging: No results found.   Assessment & Plan:   Primary hypertension - CBC - Comprehensive metabolic panel   Follow up:  Follow up in 3 months     Fenton Foy, NP 02/03/2022

## 2022-02-03 NOTE — Assessment & Plan Note (Signed)
-   CBC - Comprehensive metabolic panel   Follow up:  Follow up in 3 months

## 2022-02-04 LAB — CBC
Hematocrit: 45.9 % (ref 34.0–46.6)
Hemoglobin: 15.2 g/dL (ref 11.1–15.9)
MCH: 31.2 pg (ref 26.6–33.0)
MCHC: 33.1 g/dL (ref 31.5–35.7)
MCV: 94 fL (ref 79–97)
Platelets: 209 10*3/uL (ref 150–450)
RBC: 4.87 x10E6/uL (ref 3.77–5.28)
RDW: 12.1 % (ref 11.7–15.4)
WBC: 7.6 10*3/uL (ref 3.4–10.8)

## 2022-02-04 LAB — COMPREHENSIVE METABOLIC PANEL
ALT: 21 IU/L (ref 0–32)
AST: 14 IU/L (ref 0–40)
Albumin/Globulin Ratio: 1.7 (ref 1.2–2.2)
Albumin: 4.7 g/dL (ref 3.9–4.9)
Alkaline Phosphatase: 101 IU/L (ref 44–121)
BUN/Creatinine Ratio: 24 (ref 12–28)
BUN: 16 mg/dL (ref 8–27)
Bilirubin Total: 0.5 mg/dL (ref 0.0–1.2)
CO2: 24 mmol/L (ref 20–29)
Calcium: 9.9 mg/dL (ref 8.7–10.3)
Chloride: 101 mmol/L (ref 96–106)
Creatinine, Ser: 0.67 mg/dL (ref 0.57–1.00)
Globulin, Total: 2.7 g/dL (ref 1.5–4.5)
Glucose: 110 mg/dL — ABNORMAL HIGH (ref 70–99)
Potassium: 4.9 mmol/L (ref 3.5–5.2)
Sodium: 139 mmol/L (ref 134–144)
Total Protein: 7.4 g/dL (ref 6.0–8.5)
eGFR: 95 mL/min/{1.73_m2} (ref >59)

## 2022-02-24 ENCOUNTER — Telehealth (HOSPITAL_COMMUNITY): Payer: Self-pay | Admitting: Emergency Medicine

## 2022-02-24 ENCOUNTER — Other Ambulatory Visit (HOSPITAL_BASED_OUTPATIENT_CLINIC_OR_DEPARTMENT_OTHER): Payer: Self-pay

## 2022-02-24 MED ORDER — FLUAD QUADRIVALENT 0.5 ML IM PRSY
PREFILLED_SYRINGE | INTRAMUSCULAR | 0 refills | Status: DC
Start: 2022-02-24 — End: 2023-06-11
  Filled 2022-02-24: qty 0.5, 1d supply, fill #0

## 2022-02-24 NOTE — Telephone Encounter (Signed)
"  We're sorry, your call cannot be completed at this time, please try your call again later, goodbye."  Marchia Bond RN Navigator Cardiac [facility] [phone] Office  [phone] Cell

## 2022-02-28 ENCOUNTER — Ambulatory Visit (HOSPITAL_COMMUNITY): Admission: RE | Admit: 2022-02-28 | Payer: BC Managed Care – PPO | Source: Ambulatory Visit

## 2022-03-08 ENCOUNTER — Other Ambulatory Visit (HOSPITAL_BASED_OUTPATIENT_CLINIC_OR_DEPARTMENT_OTHER): Payer: Self-pay

## 2022-03-08 MED ORDER — COVID-19 MRNA 2023-2024 VACCINE (COMIRNATY) 0.3 ML INJECTION
INTRAMUSCULAR | 0 refills | Status: DC
  Filled 2022-03-08: qty 0.3, 1d supply, fill #0

## 2022-03-27 ENCOUNTER — Ambulatory Visit: Payer: BC Managed Care – PPO | Admitting: Cardiology

## 2022-03-28 ENCOUNTER — Other Ambulatory Visit (HOSPITAL_BASED_OUTPATIENT_CLINIC_OR_DEPARTMENT_OTHER): Payer: Self-pay

## 2022-03-28 MED ORDER — PREVNAR 20 0.5 ML IM SUSY
PREFILLED_SYRINGE | INTRAMUSCULAR | 0 refills | Status: DC
Start: 2022-03-28 — End: 2023-06-11
  Filled 2022-03-28: qty 0.5, 1d supply, fill #0

## 2022-03-28 NOTE — Progress Notes (Signed)
Cardiology Office Note:    Date:  03/29/2022   ID:  Theresa Hancock, DOB January 07, 1951, MRN [id number]  PCP:  Ivonne Andrew, NP  Cardiologist:  Little Ishikawa, MD  Electrophysiologist:  None   Referring MD: Ivonne Andrew, NP   Chief Complaint  Patient presents with   Congestive Heart Failure    History of Present Illness:    Theresa Hancock is a 71 y.o. female with a hx of HFrEF (EF 40 to 45%), hypertension, breast cancer who presents for follow-up.  She was admitted 09/2021 with dyspnea.  Echo 10/14/2021 showed EF 40 to 45%.  LHC showed normal coronary arteries.  She received IV diuresis and was discharged on p.o. Lasix 40 mg daily.  Was severely hypertensive on admission, initially required nitroglycerin drip.  She was transitioned to [employer], spironolactone, and Farxiga on discharge.  She reports that she is doing better.  Reports dyspnea with significant exertion such as mowing the lawn.  Otherwise denies any chest pain, lightheadedness, syncope, lower extremity edema, or palpitations.  She reports she takes her mother for a walk daily for about 1 hour, pushes her in her wheelchair.  Denies any symptoms with this.  Wt Readings from Last 3 Encounters:  03/29/22 204 lb (92.5 kg)  02/03/22 201 lb 9.6 oz (91.4 kg)  11/09/21 198 lb 6.4 oz (90 kg)    BP Readings from Last 3 Encounters:  03/29/22 122/70  02/03/22 127/65  11/09/21 128/62      Past Medical History:  Diagnosis Date   Breast cancer [facility]     Past Surgical History:  Procedure Laterality Date   BILATERAL TOTAL MASTECTOMY WITH AXILLARY LYMPH NODE DISSECTION Bilateral 1999   LAPAROSCOPIC TOTAL HYSTERECTOMY Bilateral 1999   RIGHT/LEFT HEART CATH AND CORONARY ANGIOGRAPHY N/A 10/14/2021   Procedure: RIGHT/LEFT HEART CATH AND CORONARY ANGIOGRAPHY;  Surgeon: Marykay Lex, MD;  Location[facility] INVASIVE CV LAB;  Service: Cardiovascular;  Laterality: N/A;    Current Medications: Current Meds   Medication Sig   atorvastatin (LIPITOR) 40 MG tablet Take 1 tablet (40 mg total) by mouth daily.   carvedilol (COREG) 12.5 MG tablet Take 1 tablet (12.5 mg total) by mouth 2 (two) times daily with a meal.   COVID-19 mRNA bivalent vaccine, Moderna, (MODERNA COVID-19 BIVAL BOOSTER) 50 MCG/0.5ML injection Inject into the muscle.   COVID-19 mRNA vaccine 2023-2024 (COMIRNATY) SUSP injection Inject into the muscle.   dapagliflozin propanediol (FARXIGA) 10 MG TABS tablet Take 1 tablet (10 mg total) by mouth daily.   furosemide (LASIX) 40 MG tablet Take 1 tablet (40 mg total) by mouth daily.   influenza vaccine adjuvanted (FLUAD QUADRIVALENT) 0.5 ML injection Inject into the muscle.   pneumococcal 20-valent conjugate vaccine (PREVNAR 20) 0.5 ML injection Inject into the muscle.   RSV vaccine recomb adjuvanted (AREXVY) 120 MCG/0.5ML injection Inject into the muscle.   sacubitril-valsartan (ENTRESTO) 24-26 MG Take 1 tablet by mouth 2 (two) times daily.   spironolactone (ALDACTONE) 25 MG tablet Take 1 tablet (25 mg total) by mouth daily.   [DISCONTINUED] aspirin EC 81 MG tablet Take 1 tablet (81 mg total) by mouth daily. Swallow whole.     Allergies:   Patient has no known allergies.   Social History   Socioeconomic History   Marital status: Single    Spouse name: Not on file   Number of children: Not on file   Years of education: Not on file   Highest education level: Not  on file  Occupational History   Not on file  Tobacco Use   Smoking status: Never   Smokeless tobacco: Never  Vaping Use   Vaping Use: Never used  Substance and Sexual Activity   Alcohol use: Yes    Alcohol/week: 1.0 standard drink of alcohol    Types: 1 Glasses of wine per week    Comment: 1/2 every to to 3 days   Drug use: Never   Sexual activity: Not Currently  Other Topics Concern   Not on file  Social History Narrative   Not on file   Social Determinants of Health   Financial Resource Strain: Not on file   Food Insecurity: Not on file  Transportation Needs: Not on file  Physical Activity: Not on file  Stress: Not on file  Social Connections: Not on file     Family History: The patient's family history includes Breast cancer in her mother; Heart attack in her father; Heart failure in her brother.  ROS:   Please see the history of present illness.     All other systems reviewed and are negative.  EKGs/Labs/Other Studies Reviewed:    The following studies were reviewed today:   EKG:   03/29/22: NSR, rate 79, LBBB  Recent Labs: 10/13/2021: B Natriuretic Peptide 54.0; TSH 0.852 10/16/2021: Magnesium 2.2 02/03/2022: ALT 21; BUN 16; Creatinine, Ser 0.67; Hemoglobin 15.2; Platelets 209; Potassium 4.9; Sodium 139  Recent Lipid Panel    Component Value Date/Time   CHOL 216 (H) 10/13/2021 1159   TRIG 77 10/13/2021 1159   HDL 79 10/13/2021 1159   CHOLHDL 2.7 10/13/2021 1159   VLDL 15 10/13/2021 1159   LDLCALC 122 (H) 10/13/2021 1159    Physical Exam:    VS:  BP 122/70 (BP Location: Left Arm, Patient Position: Sitting, Cuff Size: Large)   Pulse 79   Ht 5\' 2"  (1.575 m)   Wt 204 lb (92.5 kg)   SpO2 96%   BMI 37.31 kg/m     Wt Readings from Last 3 Encounters:  03/29/22 204 lb (92.5 kg)  02/03/22 201 lb 9.6 oz (91.4 kg)  11/09/21 198 lb 6.4 oz (90 kg)     GEN:  Well nourished, well developed in no acute distress HEENT: Normal NECK: No JVD; No carotid bruits LYMPHATICS: No lymphadenopathy CARDIAC: RRR, no murmurs, rubs, gallops RESPIRATORY:  Clear to auscultation without rales, wheezing or rhonchi  ABDOMEN: Soft, non-tender, non-distended MUSCULOSKELETAL:  No edema; No deformity  SKIN: Warm and dry NEUROLOGIC:  Alert and oriented x 3 PSYCHIATRIC:  Normal affect   ASSESSMENT:    1. Acute systolic heart failure (HCC)   2. Essential hypertension   3. Mixed hyperlipidemia    PLAN:    Acute systolic heart failure: admitted 09/2021 with dyspnea.  Echo 10/14/2021 showed EF 40  to 45%.  LHC 10/14/2021 showed normal coronary arteries.  Markedly hypertensive on admission, could be secondary to uncontrolled hypertension -Continue Lasix 40 mg daily.  Appears euvolemic.  Check BMET, magnesium -Continue Entresto 24-26 mg twice daily -Continue Coreg 12.5 mg twice daily -Continue spironolactone 25 mg daily -Continue Farxiga 10 mg daily -Cardiac MRI to work-up etiology of nonischemic cardiomyopathy  Hypertension: Continue Entresto, Coreg, spironolactone as above  Hyperlipidemia: Continue Lipitor 40 mg daily.  LDL 122 on 10/13/2021.  Check lipid panel  RTC in 3  Medication Adjustments/Labs and Tests Ordered: Current medicines are reviewed at length with the patient today.  Concerns regarding medicines are outlined above.  Orders  Placed This Encounter  Procedures   Basic metabolic panel   Magnesium   Lipid panel   CBC   EKG 12-Lead   No orders of the defined types were placed in this encounter.   Patient Instructions  Medication Instructions:  STOP aspirin  *If you need a refill on your cardiac medications before your next appointment, please call your pharmacy*   Lab Work: BMET, CBC, Lipid, Mag today  If you have labs (blood work) drawn today and your tests are completely normal, you will receive your results only by: MyChart Message (if you have MyChart) OR A paper copy in the mail If you have any lab test that is abnormal or we need to change your treatment, we will call you to review the results.   Testing/Procedures: Your physician has requested that you have a cardiac MRI. Cardiac MRI uses a computer to create images of your heart as its beating, producing both still and moving pictures of your heart and major blood vessels. For further information please visit [email or web address]. Please follow the instruction sheet given to you today for more information.  Follow-Up: At [facility], you and your health needs are our priority.  As part of  our continuing mission to provide you with exceptional heart care, we have created designated Provider Care Teams.  These Care Teams include your primary Cardiologist (physician) and Advanced Practice Providers (APPs -  Physician Assistants and Nurse Practitioners) who all work together to provide you with the care you need, when you need it.  We recommend signing up for the patient portal called "MyChart".  Sign up information is provided on this After Visit Summary.  MyChart is used to connect with patients for Virtual Visits (Telemedicine).  Patients are able to view lab/test results, encounter notes, upcoming appointments, etc.  Non-urgent messages can be sent to your provider as well.   To learn more about what you can do with MyChart, go to [email or web address].    Your next appointment:   3 month(s)  The format for your next appointment:   In Person  Provider:   Little Ishikawa, MD     Other Instructions   You are scheduled for Cardiac MRI on ______________. Please arrive for your appointment at ______________ ( arrive 30-45 minutes prior to test start time). ?  [facility] [address] [place], [place] [address] [phone] Please take advantage of the free valet parking available at the MAIN entrance (A entrance).  Proceed to the [facility] Radiology Department (First Floor) for check-in.   OR   [facility] [address] [place], [place] [address] [phone] Please take advantage of the free valet parking available at the MAIN entrance. Proceed to [facility] registration for check-in (first floor).  Magnetic resonance imaging (MRI) is a painless test that produces images of the inside of the body without using Xrays.  During an MRI, strong magnets and radio waves work together in a Data processing manager to form detailed images.   MRI images may provide more details about a medical condition than X-rays, CT scans, and  ultrasounds can provide.  You may be given earphones to listen for instructions.  You may eat a light breakfast and take medications as ordered with the exception of HCTZ (fluid pill, other). Please avoid stimulants for 12 hr prior to test. (Ie. Caffeine, nicotine, chocolate, or antihistamine medications)  If a contrast material will be used, an IV will be inserted into one  of your veins. Contrast material will be injected into your IV. It will leave your body through your urine within a day. You may be told to drink plenty of fluids to help flush the contrast material out of your system.  You will be asked to remove all metal, including: Watch, jewelry, and other metal objects including hearing aids, hair pieces and dentures. Also wearable glucose monitoring systems (ie. Freestyle Libre and Omnipods) (Braces and fillings normally are not a problem.)   TEST WILL TAKE APPROXIMATELY 1 HOUR  PLEASE NOTIFY SCHEDULING AT LEAST 24 HOURS IN ADVANCE IF YOU ARE UNABLE TO KEEP YOUR APPOINTMENT. [phone]  Please call Rockwell Alexandria, cardiac imaging nurse navigator with any questions/concerns. Rockwell Alexandria RN Navigator Cardiac Imaging Larey Brick RN Navigator Cardiac Imaging [facility] Heart and Vascular Services [phone] Office          Signed, Little Ishikawa, MD  03/29/2022 2:06 PM    [facility]

## 2022-03-29 ENCOUNTER — Encounter: Payer: Self-pay | Admitting: Cardiology

## 2022-03-29 ENCOUNTER — Ambulatory Visit: Payer: BC Managed Care – PPO | Attending: Cardiology | Admitting: Cardiology

## 2022-03-29 VITALS — BP 122/70 | HR 79 | Ht 62.0 in | Wt 204.0 lb

## 2022-03-29 DIAGNOSIS — I5021 Acute systolic (congestive) heart failure: Secondary | ICD-10-CM | POA: Diagnosis not present

## 2022-03-29 DIAGNOSIS — E782 Mixed hyperlipidemia: Secondary | ICD-10-CM | POA: Diagnosis not present

## 2022-03-29 DIAGNOSIS — I10 Essential (primary) hypertension: Secondary | ICD-10-CM | POA: Diagnosis not present

## 2022-03-29 NOTE — Patient Instructions (Signed)
Medication Instructions:  STOP aspirin  *If you need a refill on your cardiac medications before your next appointment, please call your pharmacy*   Lab Work: BMET, CBC, Lipid, Mag today  If you have labs (blood work) drawn today and your tests are completely normal, you will receive your results only by: [place] (if you have MyChart) OR A paper copy in the mail If you have any lab test that is abnormal or we need to change your treatment, we will call you to review the results.   Testing/Procedures: Your physician has requested that you have a cardiac MRI. Cardiac MRI uses a computer to create images of your heart as its beating, producing both still and moving pictures of your heart and major blood vessels. For further information please visit [email or web address] Please follow the instruction sheet given to you today for more information.  Follow-Up: At [facility], you and your health needs are our priority.  As part of our continuing mission to provide you with exceptional heart care, we have created designated Provider Care Teams.  These Care Teams include your primary Cardiologist (physician) and Advanced Practice Providers (APPs -  Physician Assistants and Nurse Practitioners) who all work together to provide you with the care you need, when you need it.  We recommend signing up for the patient portal called "MyChart".  Sign up information is provided on this After Visit Summary.  MyChart is used to connect with patients for Virtual Visits (Telemedicine).  Patients are able to view lab/test results, encounter notes, upcoming appointments, etc.  Non-urgent messages can be sent to your provider as well.   To learn more about what you can do with MyChart, go to [email or web address].    Your next appointment:   3 month(s)  The format for your next appointment:   In Person  Provider:   Donato Heinz, MD     Other Instructions   You are scheduled for  Cardiac MRI on ______________. Please arrive for your appointment at ______________ ( arrive 30-45 minutes prior to test start time). ?  [facility] [address] [place], [place] [address] [phone] Please take advantage of the free valet parking available at the MAIN entrance (A entrance).  Proceed to the [facility] Radiology Department (First Floor) for check-in.   [facility] [place], [place] [address] [phone] Please take advantage of the free valet parking available at the MAIN entrance. Proceed to [facility] registration for check-in (first floor).  Magnetic resonance imaging (MRI) is a painless test that produces images of the inside of the body without using Xrays.  During an MRI, strong magnets and radio waves work together in a Research officer, political party to form detailed images.   MRI images may provide more details about a medical condition than X-rays, CT scans, and ultrasounds can provide.  You may be given earphones to listen for instructions.  You may eat a light breakfast and take medications as ordered with the exception of HCTZ (fluid pill, other). Please avoid stimulants for 12 hr prior to test. (Ie. Caffeine, nicotine, chocolate, or antihistamine medications)  If a contrast material will be used, an IV will be inserted into one of your veins. Contrast material will be injected into your IV. It will leave your body through your urine within a day. You may be told to drink plenty of fluids to help flush the contrast material out of your system.  You  will be asked to remove all metal, including: Watch, jewelry, and other metal objects including hearing aids, hair pieces and dentures. Also wearable glucose monitoring systems (ie. Freestyle Libre and Omnipods) (Braces and fillings normally are not a problem.)   TEST WILL TAKE APPROXIMATELY 1 HOUR  PLEASE NOTIFY SCHEDULING AT LEAST 24 HOURS IN ADVANCE IF YOU ARE UNABLE  TO KEEP YOUR APPOINTMENT. [phone]  Please call Marchia Bond, cardiac imaging nurse navigator with any questions/concerns. Marchia Bond RN Navigator Cardiac Imaging Gordy Clement RN Navigator Cardiac Imaging [facility] Heart and Vascular Services [phone] Office

## 2022-03-30 ENCOUNTER — Encounter: Payer: Self-pay | Admitting: *Deleted

## 2022-03-30 LAB — LIPID PANEL
Chol/HDL Ratio: 2.7 ratio (ref 0.0–4.4)
Cholesterol, Total: 182 mg/dL (ref 100–199)
HDL: 67 mg/dL (ref >39)
LDL Chol Calc (NIH): 99 mg/dL (ref 0–99)
Triglycerides: 91 mg/dL (ref 0–149)
VLDL Cholesterol Cal: 16 mg/dL (ref 5–40)

## 2022-03-30 LAB — CBC
Hematocrit: 49.6 % — ABNORMAL HIGH (ref 34.0–46.6)
Hemoglobin: 16.9 g/dL — ABNORMAL HIGH (ref 11.1–15.9)
MCH: 31.3 pg (ref 26.6–33.0)
MCHC: 34.1 g/dL (ref 31.5–35.7)
MCV: 92 fL (ref 79–97)
Platelets: 288 10*3/uL (ref 150–450)
RBC: 5.4 x10E6/uL — ABNORMAL HIGH (ref 3.77–5.28)
RDW: 12 % (ref 11.7–15.4)
WBC: 7.1 10*3/uL (ref 3.4–10.8)

## 2022-03-30 LAB — BASIC METABOLIC PANEL
BUN/Creatinine Ratio: 30 — ABNORMAL HIGH (ref 12–28)
BUN: 18 mg/dL (ref 8–27)
CO2: 25 mmol/L (ref 20–29)
Calcium: 9.6 mg/dL (ref 8.7–10.3)
Chloride: 102 mmol/L (ref 96–106)
Creatinine, Ser: 0.61 mg/dL (ref 0.57–1.00)
Glucose: 100 mg/dL — ABNORMAL HIGH (ref 70–99)
Potassium: 4 mmol/L (ref 3.5–5.2)
Sodium: 143 mmol/L (ref 134–144)
eGFR: 97 mL/min/{1.73_m2} (ref >59)

## 2022-03-30 LAB — MAGNESIUM: Magnesium: 2.2 mg/dL (ref 1.6–2.3)

## 2022-04-21 ENCOUNTER — Other Ambulatory Visit (HOSPITAL_BASED_OUTPATIENT_CLINIC_OR_DEPARTMENT_OTHER): Payer: Self-pay

## 2022-04-21 MED ORDER — SHINGRIX 50 MCG/0.5ML IM SUSR
INTRAMUSCULAR | 1 refills | Status: DC
  Filled 2022-04-21: qty 0.5, 1d supply, fill #0

## 2022-05-05 ENCOUNTER — Encounter: Payer: Self-pay | Admitting: Nurse Practitioner

## 2022-05-05 ENCOUNTER — Ambulatory Visit (INDEPENDENT_AMBULATORY_CARE_PROVIDER_SITE_OTHER): Payer: BC Managed Care – PPO | Admitting: Nurse Practitioner

## 2022-05-05 VITALS — BP 114/59 | HR 70 | Temp 97.2°F | Ht 62.0 in | Wt 203.0 lb

## 2022-05-05 DIAGNOSIS — E785 Hyperlipidemia, unspecified: Secondary | ICD-10-CM

## 2022-05-05 DIAGNOSIS — I5021 Acute systolic (congestive) heart failure: Secondary | ICD-10-CM | POA: Diagnosis not present

## 2022-05-05 NOTE — Patient Instructions (Signed)
1. Acute systolic heart failure ([place])   2. Hyperlipidemia, unspecified hyperlipidemia type   Continue to follow with cardiology  Stay active  Low salt diet   Follow up:  Follow up in 6 months

## 2022-05-05 NOTE — Assessment & Plan Note (Signed)
2. Hyperlipidemia, unspecified hyperlipidemia type   Continue to follow with cardiology  Stay active  Low salt diet   Follow up:  Follow up in 6 months

## 2022-05-05 NOTE — Progress Notes (Signed)
$'@Patient'F$  ID: Theresa Hancock, female    DOB: [birth year], 71 y.o.   MRN: [id number]  Chief Complaint  Patient presents with   Follow-up    3 month    Referring provider: Fenton Foy, NP   HPI  71 year old female with history of acute systolic heart failure, demand ischemia, cardiomyopathy, hypertension, pneumonia, breast cancer, hyperlipidemia.   Patient presents for a follow up today.  She states that she has been doing well.  She states that she is back to normal after her recent hospitalization.  She is following low-salt diet.   She has follow-up with cardiology since her last visit here.  She is compliant with medications. Denies f/c/s, n/v/d, hemoptysis, PND, leg swelling Denies chest pain or edema    No Known Allergies  Immunization History  Administered Date(s) Administered   COVID-19, mRNA, vaccine(Comirnaty)12 years and older 03/08/2022   Fluad Quad(high Dose 65+) 02/27/2022   Moderna Covid-19 Vaccine Bivalent Booster 31yr & up 03/10/2021   PNEUMOCOCCAL CONJUGATE-20 03/28/2022   Respiratory Syncytial Virus Vaccine,Recomb Aduvanted(Arexvy) 02/01/2022   Zoster Recombinat (Shingrix) 04/21/2022    Past Medical History:  Diagnosis Date   Breast cancer ([place]     Tobacco History: Social History   Tobacco Use  Smoking Status Never  Smokeless Tobacco Never   Counseling given: Not Answered   Outpatient Encounter Medications as of 05/05/2022  Medication Sig   atorvastatin (LIPITOR) 40 MG tablet Take 1 tablet (40 mg total) by mouth daily.   carvedilol (COREG) 12.5 MG tablet Take 1 tablet (12.5 mg total) by mouth 2 (two) times daily with a meal.   COVID-19 mRNA bivalent vaccine, Moderna, (MODERNA COVID-19 BIVAL BOOSTER) 50 MCG/0.5ML injection Inject into the muscle.   COVID-19 mRNA vaccine 2023-2024 (COMIRNATY) SUSP injection Inject into the muscle.   dapagliflozin propanediol (FARXIGA) 10 MG TABS tablet Take 1 tablet (10 mg total) by mouth daily.    furosemide (LASIX) 40 MG tablet Take 1 tablet (40 mg total) by mouth daily.   influenza vaccine adjuvanted (FLUAD QUADRIVALENT) 0.5 ML injection Inject into the muscle.   pneumococcal 20-valent conjugate vaccine (PREVNAR 20) 0.5 ML injection Inject into the muscle.   RSV vaccine recomb adjuvanted (AREXVY) 120 MCG/0.5ML injection Inject into the muscle.   sacubitril-valsartan (ENTRESTO) 24-26 MG Take 1 tablet by mouth 2 (two) times daily.   spironolactone (ALDACTONE) 25 MG tablet Take 1 tablet (25 mg total) by mouth daily.   Zoster Vaccine Adjuvanted ([facility] injection Inject into the muscle.   No facility-administered encounter medications on file as of 05/05/2022.     Review of Systems  Review of Systems  Constitutional: Negative.   HENT: Negative.    Cardiovascular: Negative.   Gastrointestinal: Negative.   Allergic/Immunologic: Negative.   Neurological: Negative.   Psychiatric/Behavioral: Negative.         Physical Exam  BP (!) 114/59   Pulse 70   Temp (!) 97.2 F (36.2 C)   Ht '5\' 2"'$  (1.575 m)   Wt 203 lb (92.1 kg)   SpO2 98%   BMI 37.13 kg/m   Wt Readings from Last 5 Encounters:  05/05/22 203 lb (92.1 kg)  03/29/22 204 lb (92.5 kg)  02/03/22 201 lb 9.6 oz (91.4 kg)  11/09/21 198 lb 6.4 oz (90 kg)  11/03/21 194 lb 3.2 oz (88.1 kg)     Physical Exam Vitals and nursing note reviewed.  Constitutional:      General: She is not in acute distress.  Appearance: She is well-developed.  Cardiovascular:     Rate and Rhythm: Normal rate and regular rhythm.  Pulmonary:     Effort: Pulmonary effort is normal.     Breath sounds: Normal breath sounds.  Neurological:     Mental Status: She is alert and oriented to person, place, and time.         Assessment & Plan:   Acute systolic heart failure ([place]) 2. Hyperlipidemia, unspecified hyperlipidemia type   Continue to follow with cardiology  Stay active  Low salt diet   Follow up:  Follow up in 6  months     Fenton Foy, NP 05/05/2022

## 2022-06-13 ENCOUNTER — Other Ambulatory Visit: Payer: Self-pay | Admitting: Student

## 2022-06-29 ENCOUNTER — Other Ambulatory Visit: Payer: Self-pay | Admitting: Student

## 2022-07-02 NOTE — Progress Notes (Deleted)
Cardiology Office Note:    Date:  03/29/2022   ID:  Theresa Hancock, DOB [birth year], MRN [id number]  PCP:  Fenton Foy, NP  Cardiologist:  Donato Heinz, MD  Electrophysiologist:  None   Referring MD: Fenton Foy, NP   Chief Complaint  Patient presents with   Congestive Heart Failure    History of Present Illness:    Theresa Hancock is a 72 y.o. female with a hx of HFrEF (EF 40 to 45%), hypertension, breast cancer who presents for follow-up.  She was admitted 09/2021 with dyspnea.  Echo 10/14/2021 showed EF 40 to 45%.  LHC showed normal coronary arteries.  She received IV diuresis and was discharged on p.o. Lasix 40 mg daily.  Was severely hypertensive on admission, initially required nitroglycerin drip.  She was transitioned to [employer], spironolactone, and Farxiga on discharge.  Since last clinic visit,  she reports that she is doing better.  Reports dyspnea with significant exertion such as mowing the lawn.  Otherwise denies any chest pain, lightheadedness, syncope, lower extremity edema, or palpitations.  She reports she takes her mother for a walk daily for about 1 hour, pushes her in her wheelchair.  Denies any symptoms with this.  Wt Readings from Last 3 Encounters:  03/29/22 204 lb (92.5 kg)  02/03/22 201 lb 9.6 oz (91.4 kg)  11/09/21 198 lb 6.4 oz (90 kg)    BP Readings from Last 3 Encounters:  03/29/22 122/70  02/03/22 127/65  11/09/21 128/62      Past Medical History:  Diagnosis Date   Breast cancer [facility])     Past Surgical History:  Procedure Laterality Date   BILATERAL TOTAL MASTECTOMY WITH AXILLARY LYMPH NODE DISSECTION Bilateral 1999   LAPAROSCOPIC TOTAL HYSTERECTOMY Bilateral 1999   RIGHT/LEFT HEART CATH AND CORONARY ANGIOGRAPHY N/A 10/14/2021   Procedure: RIGHT/LEFT HEART CATH AND CORONARY ANGIOGRAPHY;  Surgeon: Leonie Man, MD;  Location: [place] [facility];  Service: Cardiovascular;  Laterality: N/A;    Current  Medications: Current Meds  Medication Sig   atorvastatin (LIPITOR) 40 MG tablet Take 1 tablet (40 mg total) by mouth daily.   carvedilol (COREG) 12.5 MG tablet Take 1 tablet (12.5 mg total) by mouth 2 (two) times daily with a meal.   COVID-19 mRNA bivalent vaccine, Moderna, (MODERNA COVID-19 BIVAL BOOSTER) 50 MCG/0.5ML injection Inject into the muscle.   COVID-19 mRNA vaccine 2023-2024 (COMIRNATY) SUSP injection Inject into the muscle.   dapagliflozin propanediol (FARXIGA) 10 MG TABS tablet Take 1 tablet (10 mg total) by mouth daily.   furosemide (LASIX) 40 MG tablet Take 1 tablet (40 mg total) by mouth daily.   influenza vaccine adjuvanted (FLUAD QUADRIVALENT) 0.5 ML injection Inject into the muscle.   pneumococcal 20-valent conjugate vaccine (PREVNAR 20) 0.5 ML injection Inject into the muscle.   RSV vaccine recomb adjuvanted (AREXVY) 120 MCG/0.5ML injection Inject into the muscle.   sacubitril-valsartan (ENTRESTO) 24-26 MG Take 1 tablet by mouth 2 (two) times daily.   spironolactone (ALDACTONE) 25 MG tablet Take 1 tablet (25 mg total) by mouth daily.   [DISCONTINUED] aspirin EC 81 MG tablet Take 1 tablet (81 mg total) by mouth daily. Swallow whole.     Allergies:   Patient has no known allergies.   Social History   Socioeconomic History   Marital status: Single    Spouse name: Not on file   Number of children: Not on file   Years of education: Not on file  Highest education level: Not on file  Occupational History   Not on file  Tobacco Use   Smoking status: Never   Smokeless tobacco: Never  Vaping Use   Vaping Use: Never used  Substance and Sexual Activity   Alcohol use: Yes    Alcohol/week: 1.0 standard drink of alcohol    Types: 1 Glasses of wine per week    Comment: 1/2 every to to 3 days   Drug use: Never   Sexual activity: Not Currently  Other Topics Concern   Not on file  Social History Narrative   Not on file   Social Determinants of Health   Financial  Resource Strain: Not on file  Food Insecurity: Not on file  Transportation Needs: Not on file  Physical Activity: Not on file  Stress: Not on file  Social Connections: Not on file     Family History: The patient's family history includes Breast cancer in her mother; Heart attack in her father; Heart failure in her brother.  ROS:   Please see the history of present illness.     All other systems reviewed and are negative.  EKGs/Labs/Other Studies Reviewed:    The following studies were reviewed today:   EKG:   03/29/22: NSR, rate 79, LBBB  Recent Labs: 10/13/2021: B Natriuretic Peptide 54.0; TSH 0.852 10/16/2021: Magnesium 2.2 02/03/2022: ALT 21; BUN 16; Creatinine, Ser 0.67; Hemoglobin 15.2; Platelets 209; Potassium 4.9; Sodium 139  Recent Lipid Panel    Component Value Date/Time   CHOL 216 (H) 10/13/2021 1159   TRIG 77 10/13/2021 1159   HDL 79 10/13/2021 1159   CHOLHDL 2.7 10/13/2021 1159   VLDL 15 10/13/2021 1159   LDLCALC 122 (H) 10/13/2021 1159    Physical Exam:    VS:  BP 122/70 (BP Location: Left Arm, Patient Position: Sitting, Cuff Size: Large)   Pulse 79   Ht '5\' 2"'$  (1.575 m)   Wt 204 lb (92.5 kg)   SpO2 96%   BMI 37.31 kg/m     Wt Readings from Last 3 Encounters:  03/29/22 204 lb (92.5 kg)  02/03/22 201 lb 9.6 oz (91.4 kg)  11/09/21 198 lb 6.4 oz (90 kg)     GEN:  Well nourished, well developed in no acute distress HEENT: Normal NECK: No JVD; No carotid bruits LYMPHATICS: No lymphadenopathy CARDIAC: RRR, no murmurs, rubs, gallops RESPIRATORY:  Clear to auscultation without rales, wheezing or rhonchi  ABDOMEN: Soft, non-tender, non-distended MUSCULOSKELETAL:  No edema; No deformity  SKIN: Warm and dry NEUROLOGIC:  Alert and oriented x 3 PSYCHIATRIC:  Normal affect   ASSESSMENT:    1. Acute systolic heart failure ([place])   2. Essential hypertension   3. Mixed hyperlipidemia    PLAN:    Acute systolic heart failure: admitted 09/2021 with dyspnea.   Echo 10/14/2021 showed EF 40 to 45%.  LHC 10/14/2021 showed normal coronary arteries.  Markedly hypertensive on admission, could be secondary to uncontrolled hypertension -Continue Lasix 40 mg daily.  Appears euvolemic.  Check BMET, magnesium -Continue Entresto 24-26 mg twice daily -Continue Coreg 12.5 mg twice daily -Continue spironolactone 25 mg daily -Continue Farxiga 10 mg daily -Cardiac MRI to work-up etiology of nonischemic cardiomyopathy  Hypertension: Continue Entresto, Coreg, spironolactone as above  Hyperlipidemia: Continue Lipitor 40 mg daily, LDL 99 on 03/29/2022  RTC in 3 months  Medication Adjustments/Labs and Tests Ordered: Current medicines are reviewed at length with the patient today.  Concerns regarding medicines are outlined above.  Orders  Placed This Encounter  Procedures   Basic metabolic panel   Magnesium   Lipid panel   CBC   EKG 12-Lead   No orders of the defined types were placed in this encounter.   Patient Instructions  Medication Instructions:  STOP aspirin  *If you need a refill on your cardiac medications before your next appointment, please call your pharmacy*   Lab Work: BMET, CBC, Lipid, Mag today  If you have labs (blood work) drawn today and your tests are completely normal, you will receive your results only by: [place] (if you have MyChart) OR A paper copy in the mail If you have any lab test that is abnormal or we need to change your treatment, we will call you to review the results.   Testing/Procedures: Your physician has requested that you have a cardiac MRI. Cardiac MRI uses a computer to create images of your heart as its beating, producing both still and moving pictures of your heart and major blood vessels. For further information please visit [email or web address] Please follow the instruction sheet given to you today for more information.  Follow-Up: At [facility], you and your health needs are our priority.   As part of our continuing mission to provide you with exceptional heart care, we have created designated Provider Care Teams.  These Care Teams include your primary Cardiologist (physician) and Advanced Practice Providers (APPs -  Physician Assistants and Nurse Practitioners) who all work together to provide you with the care you need, when you need it.  We recommend signing up for the patient portal called "MyChart".  Sign up information is provided on this After Visit Summary.  MyChart is used to connect with patients for Virtual Visits (Telemedicine).  Patients are able to view lab/test results, encounter notes, upcoming appointments, etc.  Non-urgent messages can be sent to your provider as well.   To learn more about what you can do with MyChart, go to [email or web address].    Your next appointment:   3 month(s)  The format for your next appointment:   In Person  Provider:   Donato Heinz, MD     Other Instructions   You are scheduled for Cardiac MRI on ______________. Please arrive for your appointment at ______________ ( arrive 30-45 minutes prior to test start time). ?  [facility] [address] [place], [place] [address] [phone] Please take advantage of the free valet parking available at the MAIN entrance (A entrance).  Proceed to the [facility] Radiology Department (First Floor) for check-in.   [facility] [address] [place], [place] [address] [phone] Please take advantage of the free valet parking available at the MAIN entrance. Proceed to [facility] registration for check-in (first floor).  Magnetic resonance imaging (MRI) is a painless test that produces images of the inside of the body without using Xrays.  During an MRI, strong magnets and radio waves work together in a [employer], political party to form detailed images.   MRI images may provide more details about a medical condition than X-rays, CT scans,  and ultrasounds can provide.  You may be given earphones to listen for instructions.  You may eat a light breakfast and take medications as ordered with the exception of HCTZ (fluid pill, other). Please avoid stimulants for 12 hr prior to test. (Ie. Caffeine, nicotine, chocolate, or antihistamine medications)  If a contrast material will be used, an IV will be inserted into one  of your veins. Contrast material will be injected into your IV. It will leave your body through your urine within a day. You may be told to drink plenty of fluids to help flush the contrast material out of your system.  You will be asked to remove all metal, including: Watch, jewelry, and other metal objects including hearing aids, hair pieces and dentures. Also wearable glucose monitoring systems (ie. Freestyle Libre and Omnipods) (Braces and fillings normally are not a problem.)   TEST WILL TAKE APPROXIMATELY 1 HOUR  PLEASE NOTIFY SCHEDULING AT LEAST 24 HOURS IN ADVANCE IF YOU ARE UNABLE TO KEEP YOUR APPOINTMENT. [phone]  Please call Marchia Bond, cardiac imaging nurse navigator with any questions/concerns. Marchia Bond RN Navigator Cardiac Imaging Gordy Clement RN Navigator Cardiac Imaging [facility] [phone] Office          Signed, Donato Heinz, MD  03/29/2022 2:06 PM    [place]

## 2022-07-03 ENCOUNTER — Ambulatory Visit: Payer: BC Managed Care – PPO | Admitting: Cardiology

## 2022-07-04 ENCOUNTER — Telehealth (HOSPITAL_COMMUNITY): Payer: Self-pay | Admitting: *Deleted

## 2022-07-04 NOTE — Telephone Encounter (Signed)
Attempted to call patient regarding upcoming cardiac MRI appointment. Left message on voicemail with name and callback number  Jamien Casanova RN Navigator Cardiac Imaging [facility] [phone] Office [phone] Cell  

## 2022-07-05 ENCOUNTER — Other Ambulatory Visit: Payer: Self-pay | Admitting: General Practice

## 2022-07-05 ENCOUNTER — Ambulatory Visit (HOSPITAL_COMMUNITY)
Admission: RE | Admit: 2022-07-05 | Discharge: 2022-07-05 | Disposition: A | Discharge status: Home / Self Care | Payer: BC Managed Care – PPO | Source: Ambulatory Visit | Attending: General Practice | Admitting: General Practice

## 2022-07-05 DIAGNOSIS — I5021 Acute systolic (congestive) heart failure: Secondary | ICD-10-CM | POA: Diagnosis not present

## 2022-07-05 MED ORDER — GADOBUTROL 1 MMOL/ML IV SOLN
12.0000 mL | Freq: Once | INTRAVENOUS | Status: AC | PRN
  Administered 2022-07-05: 12 mL via INTRAVENOUS

## 2022-07-09 NOTE — Progress Notes (Unsigned)
Cardiology Office Note:    Date:  07/10/2022   ID:  Theresa Hancock, DOB August 18, 1950, MRN [id number]  PCP:  Fenton Foy, NP  Cardiologist:  Donato Heinz, MD  Electrophysiologist:  None   Referring MD: Fenton Foy, NP   Chief Complaint  Patient presents with   Congestive Heart Failure    History of Present Illness:    Theresa Hancock is a 72 y.o. female with a hx of HFrEF (EF 40 to 45%), hypertension, breast cancer who presents for follow-up.  She was admitted 09/2021 with dyspnea.  Echo 10/14/2021 showed EF 40 to 45%.  LHC showed normal coronary arteries.  She received IV diuresis and was discharged on p.o. Lasix 40 mg daily.  Was severely hypertensive on admission, initially required nitroglycerin drip.  She was transitioned to [employer], spironolactone, and Farxiga on discharge.  Cardiac MRI 07/05/2022 showed LVEF 54%, RVEF 61%, no LGE.  Since last clinic visit, she reports he is doing well.  Continues to have some dyspnea.  Denies any chest pain, lightheadedness, syncope, lower extremity edema, or palpitations.  Started walking since weather has warmed up.   Wt Readings from Last 3 Encounters:  07/10/22 200 lb 6.4 oz (90.9 kg)  05/05/22 203 lb (92.1 kg)  03/29/22 204 lb (92.5 kg)    BP Readings from Last 3 Encounters:  07/10/22 132/66  05/05/22 (!) 114/59  03/29/22 122/70      Past Medical History:  Diagnosis Date   Breast cancer [facility])     Past Surgical History:  Procedure Laterality Date   BILATERAL TOTAL MASTECTOMY WITH AXILLARY LYMPH NODE DISSECTION Bilateral 1999   LAPAROSCOPIC TOTAL HYSTERECTOMY Bilateral 1999   RIGHT/LEFT HEART CATH AND CORONARY ANGIOGRAPHY N/A 10/14/2021   Procedure: RIGHT/LEFT HEART CATH AND CORONARY ANGIOGRAPHY;  Surgeon: Leonie Man, MD;  Location: [place] [facility];  Service: Cardiovascular;  Laterality: N/A;    Current Medications: Current Meds  Medication Sig   atorvastatin (LIPITOR) 40 MG tablet Take 1  tablet (40 mg total) by mouth daily.   carvedilol (COREG) 12.5 MG tablet Take 1 tablet (12.5 mg total) by mouth 2 (two) times daily with a meal.   COVID-19 mRNA bivalent vaccine, Moderna, (MODERNA COVID-19 BIVAL BOOSTER) 50 MCG/0.5ML injection Inject into the muscle.   COVID-19 mRNA vaccine 2023-2024 (COMIRNATY) SUSP injection Inject into the muscle.   dapagliflozin propanediol (FARXIGA) 10 MG TABS tablet Take 1 tablet (10 mg total) by mouth daily.   influenza vaccine adjuvanted (FLUAD QUADRIVALENT) 0.5 ML injection Inject into the muscle.   pneumococcal 20-valent conjugate vaccine (PREVNAR 20) 0.5 ML injection Inject into the muscle.   RSV vaccine recomb adjuvanted (AREXVY) 120 MCG/0.5ML injection Inject into the muscle.   sacubitril-valsartan (ENTRESTO) 24-26 MG Take 1 tablet by mouth 2 (two) times daily.   spironolactone (ALDACTONE) 25 MG tablet Take 1 tablet (25 mg total) by mouth daily.   Zoster Vaccine Adjuvanted [facility]) injection Inject into the muscle.   [DISCONTINUED] furosemide (LASIX) 40 MG tablet TAKE 1 TABLET BY MOUTH EVERY DAY     Allergies:   Patient has no known allergies.   Social History   Socioeconomic History   Marital status: Single    Spouse name: Not on file   Number of children: Not on file   Years of education: Not on file   Highest education level: Not on file  Occupational History   Not on file  Tobacco Use   Smoking status: Never  Smokeless tobacco: Never  Vaping Use   Vaping Use: Never used  Substance and Sexual Activity   Alcohol use: Yes    Alcohol/week: 1.0 standard drink of alcohol    Types: 1 Glasses of wine per week    Comment: 1/2 every to to 3 days   Drug use: Never   Sexual activity: Not Currently  Other Topics Concern   Not on file  Social History Narrative   Not on file   Social Determinants of Health   Financial Resource Strain: Not on file  Food Insecurity: Not on file  Transportation Needs: Not on file  Physical Activity:  Not on file  Stress: Not on file  Social Connections: Not on file     Family History: The patient's family history includes Breast cancer in her mother; Heart attack in her father; Heart failure in her brother.  ROS:   Please see the history of present illness.     All other systems reviewed and are negative.  EKGs/Labs/Other Studies Reviewed:    The following studies were reviewed today:   EKG:   03/29/22: NSR, rate 79, LBBB  Recent Labs: 10/13/2021: B Natriuretic Peptide 54.0; TSH 0.852 02/03/2022: ALT 21 03/29/2022: BUN 18; Creatinine, Ser 0.61; Hemoglobin 16.9; Magnesium 2.2; Platelets 288; Potassium 4.0; Sodium 143  Recent Lipid Panel    Component Value Date/Time   CHOL 182 03/29/2022 1422   TRIG 91 03/29/2022 1422   HDL 67 03/29/2022 1422   CHOLHDL 2.7 03/29/2022 1422   CHOLHDL 2.7 10/13/2021 1159   VLDL 15 10/13/2021 1159   LDLCALC 99 03/29/2022 1422    Physical Exam:    VS:  BP 132/66 (BP Location: Left Arm, Patient Position: Sitting, Cuff Size: Large)   Pulse 78   Ht 5' 2"$  (1.575 m)   Wt 200 lb 6.4 oz (90.9 kg)   SpO2 96%   BMI 36.65 kg/m     Wt Readings from Last 3 Encounters:  07/10/22 200 lb 6.4 oz (90.9 kg)  05/05/22 203 lb (92.1 kg)  03/29/22 204 lb (92.5 kg)     GEN:  Well nourished, well developed in no acute distress HEENT: Normal NECK: No JVD; No carotid bruits CARDIAC: RRR, no murmurs, rubs, gallops RESPIRATORY:  Clear to auscultation without rales, wheezing or rhonchi  ABDOMEN: Soft, non-tender, non-distended MUSCULOSKELETAL:  No edema; No deformity  SKIN: Warm and dry NEUROLOGIC:  Alert and oriented x 3 PSYCHIATRIC:  Normal affect   ASSESSMENT:    1. Acute systolic heart failure ([place])   2. Daytime somnolence   3. Essential hypertension   4. Mixed hyperlipidemia     PLAN:    Acute systolic heart failure: admitted 09/2021 with dyspnea.  Echo 10/14/2021 showed EF 40 to 45%.  LHC 10/14/2021 showed normal coronary arteries.  Markedly  hypertensive on admission, suspect heart failure secondary to uncontrolled hypertension.  Cardiac MRI 07/05/2022 showed LVEF 54%, RVEF 61%, no LGE. - Appears euvolemic, will switch Lasix to 40 mg daily as needed.  Recommend monitor daily weights and can take if gains within 3 pounds in 1 day or 5 pounds in 1 week.  Check BMET, magnesium -Continue Entresto 24-26 mg twice daily -Continue Coreg 12.5 mg twice daily -Continue spironolactone 25 mg daily -Continue Farxiga 10 mg daily  Hypertension: Continue Entresto, Coreg, spironolactone as above  Hyperlipidemia: Continue Lipitor 40 mg daily, LDL 99 on 03/29/2022  Daytime somnolence: Check [facility] sleep study.  STOP-BANG 4  RTC in 4 months  Medication  Adjustments/Labs and Tests Ordered: Current medicines are reviewed at length with the patient today.  Concerns regarding medicines are outlined above.  Orders Placed This Encounter  Procedures   Basic Metabolic Panel (BMET)   Magnesium   [facility] Sleep Study   Meds ordered this encounter  Medications   furosemide (LASIX) 40 MG tablet    Sig: Take 1 tablet (40 mg total) by mouth daily as needed.    Dispense:  30 tablet    Refill:  5    Patient Instructions  Medication Instructions:   CHANGE FUROSEMIDE TO AS NEEDED FOR WEIGHT GAIN OF 3 POUNDS IN ONE DAY OR 5 POUNDS IN 1 WEEK  *If you need a refill on your cardiac medications before your next appointment, please call your pharmacy*   Testing/Procedures:  WatchPAT?  Is a FDA cleared portable home sleep study test that uses a watch and 3 points of contact to monitor 7 different channels, including your heart rate, oxygen saturations, body position, snoring, and chest motion.  The study is easy to use from the comfort of your own home and accurately detect sleep apnea.  Before bed, you attach the chest sensor, attached the sleep apnea bracelet to your nondominant hand, and attach the finger probe.  After the study, the raw data is downloaded  from the watch and scored for apnea events.   For more information: [email or web address]  Patient Testing Instructions:  Do not put battery into the device until bedtime when you are ready to begin the test. Please call the support number if you need assistance after following the instructions below: 24 hour support line- [phone] or ITAMAR support at [phone] (option 2)  Download the [employer] AmericanWatchPAT One" app through the google play store or App Store  Be sure to turn on or enable access to bluetooth in settlings on your smartphone/ device  Make sure no other bluetooth devices are on and within the vicinity of your smartphone/ device and WatchPAT watch during testing.  Make sure to leave your smart phone/ device plugged in and charging all night.  When ready for bed:  Follow the instructions step by step in the WatchPAT One App to activate the testing device. For additional instructions, including video instruction, visit the WatchPAT One video on Youtube. You can search for [place] One within Youtube (video is 4 minutes and 18 seconds) or enter: https://youtube/watch?[email or web address]_vbiwxE Please note: You will be prompted to enter a Pin to connect via bluetooth when starting the test. The PIN will be assigned to you when you receive the test.  The device is disposable, but it recommended that you retain the device until you receive a call letting you know the study has been received and the results have been interpreted.  We will let you know if the study did not transmit to [place] properly after the test is completed. You do not need to call us to confirm the receipt of the test.  Please complete the test within 48 hours of receiving PIN.   Frequently Asked Questions:  What is Watch [place] one?  A single use fully disposable home sleep apnea testing device and will not need to be returned after completion.  What are the requirements to use WatchPAT one?  The be able to have a  successful watchpat one sleep study, you should have your Watch pat one device, your smart phone, watch pat one app, your PIN number and Internet access What type of phone do I need?  You should  have a smart phone that uses Android 5.1 and above or any Iphone with IOS 10 and above How can I download the WatchPAT one app?  Based on your device type search for WatchPAT one app either in google play for android devices or APP store for Iphone's Where will I get my PIN for the study?  Your PIN will be provided by your physician's office. It is used for authentication and if you lose/forget your PIN, please reach out to your providers office.  I do not have Internet at home. Can I do WatchPAT one study?  WatchPAT One needs Internet connection throughout the night to be able to transmit the sleep data. You can use your home/local internet or your cellular's data package. However, it is always recommended to use home/local Internet. It is estimated that between 20MB-30MB will be used with each study.However, the application will be looking for 80MB space in the phone to start the study.  What happens if I lose internet or bluetooth connection?  During the internet disconnection, your phone will not be able to transmit the sleep data. All the data, will be stored in your phone. As soon as the internet connection is back on, the phone will being sending the sleep data. During the bluetooth disconnection, WatchPAT one will not be able to to send the sleep data to your phone. Data will be kept in the [facility] one until two devices have bluetooth connection back on. As soon as the connection is back on, WatchPAT one will send the sleep data to the phone.  How long do I need to wear the WatchPAT one?  After you start the study, you should wear the device at least 6 hours.  How far should I keep my phone from the device?  During the night, your phone should be within 15 feet.  What happens if I leave the room for  restroom or other reasons?  Leaving the room for any reason will not cause any problem. As soon as your get back to the room, both devices will reconnect and will continue to send the sleep data. Can I use my phone during the sleep study?  Yes, you can use your phone as usual during the study. But it is recommended to put your watchpat one on when you are ready to go to bed.  How will I get my study results?  A soon as you completed your study, your sleep data will be sent to the provider. They will then share the results with you when they are ready.     Follow-Up: At [facility], you and your health needs are our priority.  As part of our continuing mission to provide you with exceptional heart care, we have created designated Provider Care Teams.  These Care Teams include your primary Cardiologist (physician) and Advanced Practice Providers (APPs -  Physician Assistants and Nurse Practitioners) who all work together to provide you with the care you need, when you need it.  We recommend signing up for the patient portal called "MyChart".  Sign up information is provided on this After Visit Summary.  MyChart is used to connect with patients for Virtual Visits (Telemedicine).  Patients are able to view lab/test results, encounter notes, upcoming appointments, etc.  Non-urgent messages can be sent to your provider as well.   To learn more about what you can do with MyChart, go to [email or web address].    Your next appointment:   4 month(s)  Provider:  Donato Heinz, MD        Signed, Donato Heinz, MD  07/10/2022 3:25 PM    [place]

## 2022-07-10 ENCOUNTER — Encounter: Payer: Self-pay | Admitting: Cardiology

## 2022-07-10 ENCOUNTER — Ambulatory Visit: Payer: BC Managed Care – PPO | Attending: Cardiology | Admitting: Cardiology

## 2022-07-10 VITALS — BP 132/66 | HR 78 | Ht 62.0 in | Wt 200.4 lb

## 2022-07-10 DIAGNOSIS — R400 Somnolence: Secondary | ICD-10-CM

## 2022-07-10 DIAGNOSIS — I5021 Acute systolic (congestive) heart failure: Secondary | ICD-10-CM

## 2022-07-10 DIAGNOSIS — E782 Mixed hyperlipidemia: Secondary | ICD-10-CM

## 2022-07-10 DIAGNOSIS — I10 Essential (primary) hypertension: Secondary | ICD-10-CM | POA: Diagnosis not present

## 2022-07-10 MED ORDER — FUROSEMIDE 40 MG PO TABS: 40.0000 mg | ORAL_TABLET | Freq: Every day | ORAL | 5 refills | Status: AC | PRN

## 2022-07-10 NOTE — Patient Instructions (Signed)
Medication Instructions:   CHANGE FUROSEMIDE TO AS NEEDED FOR WEIGHT GAIN OF 3 POUNDS IN ONE DAY OR 5 POUNDS IN 1 WEEK  *If you need a refill on your cardiac medications before your next appointment, please call your pharmacy*   Testing/Procedures:  WatchPAT?  Is a FDA cleared portable home sleep study test that uses a watch and 3 points of contact to monitor 7 different channels, including your heart rate, oxygen saturations, body position, snoring, and chest motion.  The study is easy to use from the comfort of your own home and accurately detect sleep apnea.  Before bed, you attach the chest sensor, attached the sleep apnea bracelet to your nondominant hand, and attach the finger probe.  After the study, the raw data is downloaded from the watch and scored for apnea events.   For more information: [email or web address]  Patient Testing Instructions:  Do not put battery into the device until bedtime when you are ready to begin the test. Please call the support number if you need assistance after following the instructions below: 24 hour support line- [phone] or ITAMAR support at [phone] (option 2)  Download the [employer] AmericanWatchPAT One" app through the google play store or App Store  Be sure to turn on or enable access to bluetooth in settlings on your smartphone/ device  Make sure no other bluetooth devices are on and within the vicinity of your smartphone/ device and WatchPAT watch during testing.  Make sure to leave your smart phone/ device plugged in and charging all night.  When ready for bed:  Follow the instructions step by step in the WatchPAT One App to activate the testing device. For additional instructions, including video instruction, visit the WatchPAT One video on Youtube. You can search for [place] One within Youtube (video is 4 minutes and 18 seconds) or enter: https://youtube/watch?v=[email or web address]_vbiwxE Please note: You will be prompted to enter a Pin to  connect via bluetooth when starting the test. The PIN will be assigned to you when you receive the test.  The device is disposable, but it recommended that you retain the device until you receive a call letting you know the study has been received and the results have been interpreted.  We will let you know if the study did not transmit to [place] properly after the test is completed. You do not need to call us to confirm the receipt of the test.  Please complete the test within 48 hours of receiving PIN.   Frequently Asked Questions:  What is Watch [place] one?  A single use fully disposable home sleep apnea testing device and will not need to be returned after completion.  What are the requirements to use WatchPAT one?  The be able to have a successful watchpat one sleep study, you should have your Watch pat one device, your smart phone, watch pat one app, your PIN number and Internet access What type of phone do I need?  You should have a smart phone that uses Android 5.1 and above or any Iphone with IOS 10 and above How can I download the WatchPAT one app?  Based on your device type search for WatchPAT one app either in google play for android devices or APP store for Iphone's Where will I get my PIN for the study?  Your PIN will be provided by your physician's office. It is used for authentication and if you lose/forget your PIN, please reach out to your providers office.  I do not have Internet  at home. Can I do WatchPAT one study?  WatchPAT One needs Internet connection throughout the night to be able to transmit the sleep data. You can use your home/local internet or your cellular's data package. However, it is always recommended to use home/local Internet. It is estimated that between 20MB-30MB will be used with each study.However, the application will be looking for 80MB space in the phone to start the study.  What happens if I lose internet or bluetooth connection?  During the internet  disconnection, your phone will not be able to transmit the sleep data. All the data, will be stored in your phone. As soon as the internet connection is back on, the phone will being sending the sleep data. During the bluetooth disconnection, WatchPAT one will not be able to to send the sleep data to your phone. Data will be kept in the [facility] one until two devices have bluetooth connection back on. As soon as the connection is back on, WatchPAT one will send the sleep data to the phone.  How long do I need to wear the WatchPAT one?  After you start the study, you should wear the device at least 6 hours.  How far should I keep my phone from the device?  During the night, your phone should be within 15 feet.  What happens if I leave the room for restroom or other reasons?  Leaving the room for any reason will not cause any problem. As soon as your get back to the room, both devices will reconnect and will continue to send the sleep data. Can I use my phone during the sleep study?  Yes, you can use your phone as usual during the study. But it is recommended to put your watchpat one on when you are ready to go to bed.  How will I get my study results?  A soon as you completed your study, your sleep data will be sent to the provider. They will then share the results with you when they are ready.     Follow-Up: At [facility], you and your health needs are our priority.  As part of our continuing mission to provide you with exceptional heart care, we have created designated Provider Care Teams.  These Care Teams include your primary Cardiologist (physician) and Advanced Practice Providers (APPs -  Physician Assistants and Nurse Practitioners) who all work together to provide you with the care you need, when you need it.  We recommend signing up for the patient portal called "MyChart".  Sign up information is provided on this After Visit Summary.  MyChart is used to connect with patients for  Virtual Visits (Telemedicine).  Patients are able to view lab/test results, encounter notes, upcoming appointments, etc.  Non-urgent messages can be sent to your provider as well.   To learn more about what you can do with MyChart, go to [email or web address].    Your next appointment:   4 month(s)  Provider:   Donato Heinz, MD

## 2022-07-11 ENCOUNTER — Telehealth: Payer: Self-pay | Admitting: *Deleted

## 2022-07-11 LAB — BASIC METABOLIC PANEL
BUN/Creatinine Ratio: 18 (ref 12–28)
BUN: 13 mg/dL (ref 8–27)
CO2: 25 mmol/L (ref 20–29)
Calcium: 10.8 mg/dL — ABNORMAL HIGH (ref 8.7–10.3)
Chloride: 105 mmol/L (ref 96–106)
Creatinine, Ser: 0.72 mg/dL (ref 0.57–1.00)
Glucose: 111 mg/dL — ABNORMAL HIGH (ref 70–99)
Potassium: 4.1 mmol/L (ref 3.5–5.2)
Sodium: 148 mmol/L — ABNORMAL HIGH (ref 134–144)
eGFR: 90 mL/min/{1.73_m2} (ref >59)

## 2022-07-11 LAB — MAGNESIUM: Magnesium: 2.4 mg/dL — ABNORMAL HIGH (ref 1.6–2.3)

## 2022-07-11 NOTE — Telephone Encounter (Signed)
Prior Authorization for [employer] sent to [employer] via web portal. Approved. order Number [id number]. Valid dates 07/11/22 to 09/08/22.

## 2022-07-12 ENCOUNTER — Telehealth: Payer: Self-pay

## 2022-07-12 ENCOUNTER — Other Ambulatory Visit: Payer: Self-pay | Admitting: *Deleted

## 2022-07-12 DIAGNOSIS — E870 Hyperosmolality and hypernatremia: Secondary | ICD-10-CM

## 2022-07-12 DIAGNOSIS — I5021 Acute systolic (congestive) heart failure: Secondary | ICD-10-CM

## 2022-07-12 DIAGNOSIS — I10 Essential (primary) hypertension: Secondary | ICD-10-CM

## 2022-07-12 NOTE — Telephone Encounter (Signed)
Called and made the patient aware that SHE may proceed with the [facility] Sleep Study. PIN # provided to the patient. Patient made aware that SHE will be contacted after the test has been read with the results and any recommendations. Patient verbalized understanding and thanked me for the call.

## 2022-07-23 ENCOUNTER — Other Ambulatory Visit: Payer: Self-pay | Admitting: General Practice

## 2022-07-23 ENCOUNTER — Other Ambulatory Visit: Payer: Self-pay | Admitting: Student

## 2022-09-28 ENCOUNTER — Ambulatory Visit: Payer: BC Managed Care – PPO | Admitting: Nurse Practitioner

## 2022-09-28 ENCOUNTER — Encounter: Payer: Self-pay | Admitting: Nurse Practitioner

## 2022-09-28 VITALS — BP 110/47 | HR 69 | Ht 62.0 in | Wt 200.8 lb

## 2022-09-28 DIAGNOSIS — J0100 Acute maxillary sinusitis, unspecified: Secondary | ICD-10-CM

## 2022-09-28 DIAGNOSIS — I10 Essential (primary) hypertension: Secondary | ICD-10-CM

## 2022-09-28 DIAGNOSIS — Z1211 Encounter for screening for malignant neoplasm of colon: Secondary | ICD-10-CM | POA: Diagnosis not present

## 2022-09-28 MED ORDER — AMOXICILLIN-POT CLAVULANATE 875-125 MG PO TABS: 1.0000 | ORAL_TABLET | Freq: Two times a day (BID) | ORAL | 0 refills | Status: DC

## 2022-09-28 NOTE — Assessment & Plan Note (Signed)
-   CBC - Comprehensive metabolic panel  2. Acute non-recurrent maxillary sinusitis  - amoxicillin-clavulanate (AUGMENTIN) 875-125 MG tablet; Take 1 tablet by mouth 2 (two) times daily.  Dispense: 20 tablet; Refill: 0  3. Colon cancer screening  - Cologuard   Follow up:  Follow up in 6 months

## 2022-09-28 NOTE — Patient Instructions (Addendum)
1. Primary hypertension  - CBC - Comprehensive metabolic panel  2. Acute non-recurrent maxillary sinusitis  - amoxicillin-clavulanate (AUGMENTIN) 875-125 MG tablet; Take 1 tablet by mouth 2 (two) times daily.  Dispense: 20 tablet; Refill: 0  3. Colon cancer screening  - Cologuard   Follow up:  Follow up in 6 months

## 2022-09-28 NOTE — Progress Notes (Signed)
@Patient  ID: [name], female    DOB: 10/27/1950, 72 y.o.   MRN: [id number]  Chief Complaint  Patient presents with   Follow-up   Sinus Problem    Referring provider: Ivonne Andrew, NP   HPI  72 year old female with history of acute systolic heart failure, demand ischemia, cardiomyopathy, hypertension, pneumonia, breast cancer, hyperlipidemia.   Patient presents for a follow up today.  She states that she has been doing well. She is following low-salt diet.   She has followed-up with cardiology since her last visit here.  She is compliant with medications. Denies f/c/s, n/v/d, hemoptysis, PND, leg swelling Denies chest pain or edema  Sinus Congestion:   Patient states for the past couple weeks she has been having head and chest congestion.  She states that the chest congestion has cleared but she is still having significant head congestion with sinus pressure and pain.  She feels like she has a sinus infection.  She has been taking Sudafed OTC but states that this has made her blood pressure elevated.  She stopped taking this medication yesterday.    No Known Allergies  Immunization History  Administered Date(s) Administered   COVID-19, mRNA, vaccine(Comirnaty)12 years and older 03/08/2022   Fluad Quad(high Dose 65+) 02/27/2022   Moderna Covid-19 Vaccine Bivalent Booster 51yrs & up 03/10/2021   PNEUMOCOCCAL CONJUGATE-20 03/28/2022   Respiratory Syncytial Virus Vaccine,Recomb Aduvanted(Arexvy) 02/01/2022   Zoster Recombinat (Shingrix) 04/21/2022    Past Medical History:  Diagnosis Date   Breast cancer (HCC)     Tobacco History: Social History   Tobacco Use  Smoking Status Never  Smokeless Tobacco Never   Counseling given: Not Answered   Outpatient Encounter Medications as of 09/28/2022  Medication Sig   amoxicillin-clavulanate (AUGMENTIN) 875-125 MG tablet Take 1 tablet by mouth 2 (two) times daily.   atorvastatin (LIPITOR) 40 MG tablet TAKE 1 TABLET  BY MOUTH EVERY DAY   carvedilol (COREG) 12.5 MG tablet Take 1 tablet (12.5 mg total) by mouth 2 (two) times daily with a meal.   FARXIGA 10 MG TABS tablet TAKE 1 TABLET BY MOUTH EVERY DAY   furosemide (LASIX) 40 MG tablet Take 1 tablet (40 mg total) by mouth daily as needed.   sacubitril-valsartan (ENTRESTO) 24-26 MG Take 1 tablet by mouth 2 (two) times daily.   COVID-19 mRNA bivalent vaccine, Moderna, (MODERNA COVID-19 BIVAL BOOSTER) 50 MCG/0.5ML injection Inject into the muscle.   COVID-19 mRNA vaccine 2023-2024 (COMIRNATY) SUSP injection Inject into the muscle.   influenza vaccine adjuvanted (FLUAD QUADRIVALENT) 0.5 ML injection Inject into the muscle.   pneumococcal 20-valent conjugate vaccine (PREVNAR 20) 0.5 ML injection Inject into the muscle.   RSV vaccine recomb adjuvanted (AREXVY) 120 MCG/0.5ML injection Inject into the muscle.   spironolactone (ALDACTONE) 25 MG tablet TAKE 1 TABLET (25 MG TOTAL) BY MOUTH DAILY. (Patient not taking: Reported on 09/28/2022)   Zoster Vaccine Adjuvanted [facility]) injection Inject into the muscle.   No facility-administered encounter medications on file as of 09/28/2022.     Review of Systems  Review of Systems  Constitutional: Negative.   HENT:  Positive for congestion, postnasal drip, sinus pressure and sinus pain.   Respiratory:  Positive for cough.   Cardiovascular: Negative.   Gastrointestinal: Negative.   Allergic/Immunologic: Negative.   Neurological: Negative.   Psychiatric/Behavioral: Negative.         Physical Exam  BP (!) 110/47   Pulse 69   Ht 5\' 2"  (1.575 m)  Wt 200 lb 12.8 oz (91.1 kg)   SpO2 97%   BMI 36.73 kg/m   Wt Readings from Last 5 Encounters:  09/28/22 200 lb 12.8 oz (91.1 kg)  07/10/22 200 lb 6.4 oz (90.9 kg)  05/05/22 203 lb (92.1 kg)  03/29/22 204 lb (92.5 kg)  02/03/22 201 lb 9.6 oz (91.4 kg)     Physical Exam Vitals and nursing note reviewed.  Constitutional:      General: She is not in acute  distress.    Appearance: She is well-developed.  Cardiovascular:     Rate and Rhythm: Normal rate and regular rhythm.  Pulmonary:     Effort: Pulmonary effort is normal.     Breath sounds: Normal breath sounds.  Neurological:     Mental Status: She is alert and oriented to person, place, and time.      Lab Results:  CBC    Component Value Date/Time   WBC 7.1 03/29/2022 1422   WBC 10.2 10/16/2021 0325   RBC 5.40 (H) 03/29/2022 1422   RBC 5.26 (H) 10/16/2021 0325   HGB 16.9 (H) 03/29/2022 1422   HCT 49.6 (H) 03/29/2022 1422   PLT 288 03/29/2022 1422   MCV 92 03/29/2022 1422   MCH 31.3 03/29/2022 1422   MCH 31.7 10/16/2021 0325   MCHC 34.1 03/29/2022 1422   MCHC 33.8 10/16/2021 0325   RDW 12.0 03/29/2022 1422   LYMPHSABS 2.3 10/14/2021 0611   MONOABS 0.6 10/14/2021 0611   EOSABS 0.4 10/14/2021 0611   BASOSABS 0.1 10/14/2021 0611    BMET    Component Value Date/Time   NA 148 (H) 07/10/2022 1527   K 4.1 07/10/2022 1527   CL 105 07/10/2022 1527   CO2 25 07/10/2022 1527   GLUCOSE 111 (H) 07/10/2022 1527   GLUCOSE 134 (H) 10/16/2021 0325   BUN 13 07/10/2022 1527   CREATININE 0.72 07/10/2022 1527   CALCIUM 10.8 (H) 07/10/2022 1527   GFRNONAA >60 10/16/2021 0325    BNP    Component Value Date/Time   BNP 54.0 10/13/2021 0627     Assessment & Plan:   Primary hypertension - CBC - Comprehensive metabolic panel  2. Acute non-recurrent maxillary sinusitis  - amoxicillin-clavulanate (AUGMENTIN) 875-125 MG tablet; Take 1 tablet by mouth 2 (two) times daily.  Dispense: 20 tablet; Refill: 0  3. Colon cancer screening  - Cologuard   Follow up:  Follow up in 6 months     Ivonne Andrew, NP 09/28/2022

## 2022-10-16 ENCOUNTER — Other Ambulatory Visit: Payer: Self-pay | Admitting: General Practice

## 2022-11-03 ENCOUNTER — Ambulatory Visit: Payer: Self-pay | Admitting: Nurse Practitioner

## 2022-11-15 NOTE — Progress Notes (Unsigned)
Cardiology Office Note:    Date:  11/16/2022   ID:  Theresa Hancock, DOB [birth year], MRN [id number]  PCP:  Ivonne Andrew, NP  Cardiologist:  Little Ishikawa, MD  Electrophysiologist:  None   Referring MD: Ivonne Andrew, NP   Chief Complaint  Patient presents with   Congestive Heart Failure    History of Present Illness:    Theresa Hancock is a 72 y.o. female with a hx of HFrEF (EF 40 to 45%), hypertension, breast cancer who presents for follow-up.  She was admitted 09/2021 with dyspnea.  Echo 10/14/2021 showed EF 40 to 45%.  LHC showed normal coronary arteries.  She received IV diuresis and was discharged on p.o. Lasix 40 mg daily.  Was severely hypertensive on admission, initially required nitroglycerin drip.  She was transitioned to [employer], spironolactone, and Farxiga on discharge.  Cardiac MRI 07/05/2022 showed LVEF 54%, RVEF 61%, no LGE.  Since last clinic visit, she reports that she is doing well.  She is taking Lasix about every 4 to 5 days.  Denies any chest pain, dyspnea, lightheadedness, syncope, lower extremity edema, or palpitations.  Does report she has been feeling fatigued.  Has not been exercising.   Wt Readings from Last 3 Encounters:  11/16/22 204 lb 3.2 oz (92.6 kg)  09/28/22 200 lb 12.8 oz (91.1 kg)  07/10/22 200 lb 6.4 oz (90.9 kg)    BP Readings from Last 3 Encounters:  11/16/22 (!) 122/53  09/28/22 (!) 110/47  07/10/22 132/66      Past Medical History:  Diagnosis Date   Breast cancer [facility])     Past Surgical History:  Procedure Laterality Date   BILATERAL TOTAL MASTECTOMY WITH AXILLARY LYMPH NODE DISSECTION Bilateral 1999   LAPAROSCOPIC TOTAL HYSTERECTOMY Bilateral 1999   RIGHT/LEFT HEART CATH AND CORONARY ANGIOGRAPHY N/A 10/14/2021   Procedure: RIGHT/LEFT HEART CATH AND CORONARY ANGIOGRAPHY;  Surgeon: Marykay Lex, MD;  Location: [facility] INVASIVE CV LAB;  Service: Cardiovascular;  Laterality: N/A;    Current  Medications: Current Meds  Medication Sig   atorvastatin (LIPITOR) 40 MG tablet TAKE 1 TABLET BY MOUTH EVERY DAY   carvedilol (COREG) 12.5 MG tablet Take 1 tablet (12.5 mg total) by mouth 2 (two) times daily with a meal.   ENTRESTO 24-26 MG TAKE 1 TABLET BY MOUTH TWICE A DAY   FARXIGA 10 MG TABS tablet TAKE 1 TABLET BY MOUTH EVERY DAY   furosemide (LASIX) 40 MG tablet Take 1 tablet (40 mg total) by mouth daily as needed.   spironolactone (ALDACTONE) 25 MG tablet TAKE 1 TABLET (25 MG TOTAL) BY MOUTH DAILY.     Allergies:   Patient has no known allergies.   Social History   Socioeconomic History   Marital status: Single    Spouse name: Not on file   Number of children: Not on file   Years of education: Not on file   Highest education level: Not on file  Occupational History   Not on file  Tobacco Use   Smoking status: Never   Smokeless tobacco: Never  Vaping Use   Vaping Use: Never used  Substance and Sexual Activity   Alcohol use: Yes    Alcohol/week: 1.0 standard drink of alcohol    Types: 1 Glasses of wine per week    Comment: 1/2 every to to 3 days   Drug use: Never   Sexual activity: Not Currently  Other Topics Concern   Not on file  Social History Narrative   Not on file   Social Determinants of Health   Financial Resource Strain: Not on file  Food Insecurity: Not on file  Transportation Needs: Not on file  Physical Activity: Not on file  Stress: Not on file  Social Connections: Not on file     Family History: The patient's family history includes Breast cancer in her mother; Heart attack in her father; Heart failure in her brother.  ROS:   Please see the history of present illness.     All other systems reviewed and are negative.  EKGs/Labs/Other Studies Reviewed:    The following studies were reviewed today:   EKG:   03/29/22: NSR, rate 79, LBBB 11/16/22: Normal sinus rhythm, rate 74, left bundle branch block  Recent Labs: 02/03/2022: ALT  21 03/29/2022: Hemoglobin 16.9; Platelets 288 07/10/2022: BUN 13; Creatinine, Ser 0.72; Magnesium 2.4; Potassium 4.1; Sodium 148  Recent Lipid Panel    Component Value Date/Time   CHOL 182 03/29/2022 1422   TRIG 91 03/29/2022 1422   HDL 67 03/29/2022 1422   CHOLHDL 2.7 03/29/2022 1422   CHOLHDL 2.7 10/13/2021 1159   VLDL 15 10/13/2021 1159   LDLCALC 99 03/29/2022 1422    Physical Exam:    VS:  BP (!) 122/53 (BP Location: Left Arm, Patient Position: Sitting, Cuff Size: Large)   Pulse 74   Ht 5\' 2"  (1.575 m)   Wt 204 lb 3.2 oz (92.6 kg)   SpO2 93%   BMI 37.35 kg/m     Wt Readings from Last 3 Encounters:  11/16/22 204 lb 3.2 oz (92.6 kg)  09/28/22 200 lb 12.8 oz (91.1 kg)  07/10/22 200 lb 6.4 oz (90.9 kg)     GEN:  Well nourished, well developed in no acute distress HEENT: Normal NECK: No JVD; No carotid bruits CARDIAC: RRR, no murmurs, rubs, gallops RESPIRATORY:  Clear to auscultation without rales, wheezing or rhonchi  ABDOMEN: Soft, non-tender, non-distended MUSCULOSKELETAL:  No edema; No deformity  SKIN: Warm and dry NEUROLOGIC:  Alert and oriented x 3 PSYCHIATRIC:  Normal affect   ASSESSMENT:    1. Chronic systolic heart failure (HCC)   2. Non-ischemic cardiomyopathy (HCC)   3. Essential hypertension   4. Hyperlipidemia, unspecified hyperlipidemia type   5. Daytime somnolence     PLAN:    Chronic systolic heart failure: admitted 09/2021 with dyspnea.  Echo 10/14/2021 showed EF 40 to 45%.  LHC 10/14/2021 showed normal coronary arteries.  Markedly hypertensive on admission, suspect heart failure secondary to uncontrolled hypertension.  Cardiac MRI 07/05/2022 showed LVEF 54%, RVEF 61%, no LGE. - Appears euvolemic, on Lasix to 40 mg daily as needed.  Recommend monitor daily weights and can take if gains within 3 pounds in 1 day or 5 pounds in 1 week.  Check BMET, magnesium -Continue Entresto 24-26 mg twice daily -Continue Coreg 12.5 mg twice daily -Continue  spironolactone 25 mg daily -Continue Farxiga 10 mg daily -Update echocardiogram prior to next clinic visit in 6 months  Hypertension: Continue Entresto, Coreg, spironolactone as above  Hyperlipidemia: Continue Lipitor 40 mg daily, LDL 99 on 03/29/2022  Daytime somnolence: Check Itamar sleep study.  STOP-BANG 4  RTC in 6 months  Medication Adjustments/Labs and Tests Ordered: Current medicines are reviewed at length with the patient today.  Concerns regarding medicines are outlined above.  Orders Placed This Encounter  Procedures   EKG 12-Lead   No orders of the defined types were placed in this encounter.  There are no Patient Instructions on file for this visit.   Signed, Little Ishikawa, MD  11/16/2022 3:11 PM    [facility]

## 2022-11-16 ENCOUNTER — Ambulatory Visit: Payer: BC Managed Care – PPO | Attending: Cardiology | Admitting: Cardiology

## 2022-11-16 ENCOUNTER — Encounter: Payer: Self-pay | Admitting: Cardiology

## 2022-11-16 VITALS — BP 122/53 | HR 74 | Ht 62.0 in | Wt 204.2 lb

## 2022-11-16 DIAGNOSIS — E785 Hyperlipidemia, unspecified: Secondary | ICD-10-CM

## 2022-11-16 DIAGNOSIS — I5022 Chronic systolic (congestive) heart failure: Secondary | ICD-10-CM

## 2022-11-16 DIAGNOSIS — I10 Essential (primary) hypertension: Secondary | ICD-10-CM

## 2022-11-16 DIAGNOSIS — R400 Somnolence: Secondary | ICD-10-CM

## 2022-11-16 DIAGNOSIS — I428 Other cardiomyopathies: Secondary | ICD-10-CM

## 2022-11-16 NOTE — Patient Instructions (Signed)
Medication Instructions:  No changes *If you need a refill on your cardiac medications before your next appointment, please call your pharmacy*   Lab Work: BMET and MAG- today If you have labs (blood work) drawn today and your tests are completely normal, you will receive your results only by: MyChart Message (if you have MyChart) OR A paper copy in the mail If you have any lab test that is abnormal or we need to change your treatment, we will call you to review the results.   Testing/Procedures: Your physician has requested that you have an echocardiogram in 6 months (before 6 month appt with Dr Bjorn Pippin). Echocardiography is a painless test that uses sound waves to create images of your heart. It provides your doctor with information about the size and shape of your heart and how well your heart's chambers and valves are working. This procedure takes approximately one hour. There are no restrictions for this procedure. Please do NOT wear cologne, perfume, aftershave, or lotions (deodorant is allowed). Please arrive 15 minutes prior to your appointment time.    Follow-Up: At [facility], you and your health needs are our priority.  As part of our continuing mission to provide you with exceptional heart care, we have created designated Provider Care Teams.  These Care Teams include your primary Cardiologist (physician) and Advanced Practice Providers (APPs -  Physician Assistants and Nurse Practitioners) who all work together to provide you with the care you need, when you need it.  We recommend signing up for the patient portal called "MyChart".  Sign up information is provided on this After Visit Summary.  MyChart is used to connect with patients for Virtual Visits (Telemedicine).  Patients are able to view lab/test results, encounter notes, upcoming appointments, etc.  Non-urgent messages can be sent to your provider as well.   To learn more about what you can do with MyChart, go  to [email or web address].    Your next appointment:   6 month(s)  Provider:   Little Ishikawa, MD

## 2022-11-17 LAB — BASIC METABOLIC PANEL
BUN/Creatinine Ratio: 24 (ref 12–28)
BUN: 14 mg/dL (ref 8–27)
CO2: 23 mmol/L (ref 20–29)
Calcium: 10.2 mg/dL (ref 8.7–10.3)
Chloride: 100 mmol/L (ref 96–106)
Creatinine, Ser: 0.59 mg/dL (ref 0.57–1.00)
Glucose: 85 mg/dL (ref 70–99)
Potassium: 4.4 mmol/L (ref 3.5–5.2)
Sodium: 141 mmol/L (ref 134–144)
eGFR: 97 mL/min/{1.73_m2} (ref >59)

## 2022-11-17 LAB — MAGNESIUM: Magnesium: 2.4 mg/dL — ABNORMAL HIGH (ref 1.6–2.3)

## 2023-01-09 ENCOUNTER — Telehealth: Payer: Self-pay

## 2023-01-09 NOTE — Telephone Encounter (Addendum)
**Note De-Identified Wacey Zieger Obfuscation** 3rd Attempt: Concerning the Itamar-HST Device, I called the pt but got no answer so I left a message on her VM asking her to call Larita Fife back at Dr [employer] office at [facility] at [phone].  The pt was provided a Itamar-HST on 07/10/22 ordered by Dr Bjorn Pippin. She was advised on 2/14 to proceed with the HST but per chart, was not done. On 5/16 the following letter was mailed to the pt: Oct 12, 2022  Theresa Hancock [address] [place] [address] Dear Theresa Hancock,  This letter is to inform you that your [facility] Sleep Study needs to be completed or returned within 2 weeks of today's date. If the sleep study has not been done within this timeframe or the device has not been returned, then this will be handed over to our billing department per the signed waiver agreement.   If you have any questions or concerns, feel free to call us at [phone].  She has not replied to the letter, did not proceed with her HST, and did not return the device back to the office in its original unopened box.  Forwarding this note to Dr Bjorn Pippin for ok to cancel Itamar-HST

## 2023-01-10 NOTE — Telephone Encounter (Signed)
Yes that is okay to cancel

## 2023-01-17 ENCOUNTER — Other Ambulatory Visit: Payer: Self-pay | Admitting: General Practice

## 2023-02-02 NOTE — Telephone Encounter (Signed)
**Note De-Identified Jleigh Striplin Obfuscation** I have canceled the Itamar-HST from the pts active orders and I have referred to billing for unreturned WatchPat One-HST device.

## 2023-03-30 ENCOUNTER — Ambulatory Visit: Payer: Self-pay | Admitting: Nurse Practitioner

## 2023-04-02 ENCOUNTER — Encounter: Payer: Self-pay | Admitting: Nurse Practitioner

## 2023-04-02 ENCOUNTER — Ambulatory Visit: Payer: BC Managed Care – PPO | Admitting: Nurse Practitioner

## 2023-04-02 ENCOUNTER — Ambulatory Visit (INDEPENDENT_AMBULATORY_CARE_PROVIDER_SITE_OTHER): Payer: BC Managed Care – PPO | Admitting: Nurse Practitioner

## 2023-04-02 VITALS — BP 132/60 | HR 76

## 2023-04-02 DIAGNOSIS — I10 Essential (primary) hypertension: Secondary | ICD-10-CM

## 2023-04-02 NOTE — Patient Instructions (Signed)
1. Primary hypertension  Current Outpatient Medications on File Prior to Visit  Medication Sig Dispense Refill   amoxicillin-clavulanate (AUGMENTIN) 875-125 MG tablet Take 1 tablet by mouth 2 (two) times daily. (Patient not taking: Reported on 11/16/2022) 20 tablet 0   atorvastatin (LIPITOR) 40 MG tablet TAKE 1 TABLET BY MOUTH EVERY DAY 30 tablet 10   carvedilol (COREG) 12.5 MG tablet TAKE 1 TABLET (12.5MG  TOTAL) BY MOUTH TWICE A DAY WITH MEALS 60 tablet 4   COVID-19 mRNA bivalent vaccine, Moderna, (MODERNA COVID-19 BIVAL BOOSTER) 50 MCG/0.5ML injection Inject into the muscle. (Patient not taking: Reported on 11/16/2022) 0.5 mL 0   COVID-19 mRNA vaccine 2023-2024 (COMIRNATY) SUSP injection Inject into the muscle. (Patient not taking: Reported on 11/16/2022) 0.3 mL 0   ENTRESTO 24-26 MG TAKE 1 TABLET BY MOUTH TWICE A DAY 60 tablet 5   FARXIGA 10 MG TABS tablet TAKE 1 TABLET BY MOUTH EVERY DAY 30 tablet 5   furosemide (LASIX) 40 MG tablet Take 1 tablet (40 mg total) by mouth daily as needed. 30 tablet 5   influenza vaccine adjuvanted (FLUAD QUADRIVALENT) 0.5 ML injection Inject into the muscle. (Patient not taking: Reported on 11/16/2022) 0.5 mL 0   pneumococcal 20-valent conjugate vaccine (PREVNAR 20) 0.5 ML injection Inject into the muscle. (Patient not taking: Reported on 11/16/2022) 0.5 mL 0   RSV vaccine recomb adjuvanted (AREXVY) 120 MCG/0.5ML injection Inject into the muscle. (Patient not taking: Reported on 11/16/2022) 0.5 mL 0   spironolactone (ALDACTONE) 25 MG tablet TAKE 1 TABLET (25 MG TOTAL) BY MOUTH DAILY. 30 tablet 10   Zoster Vaccine Adjuvanted [facility]) injection Inject into the muscle. (Patient not taking: Reported on 11/16/2022) 0.5 mL 1   No current facility-administered medications on file prior to visit.   Follow up:  Follow up in 6 months

## 2023-04-02 NOTE — Progress Notes (Signed)
Subjective   Patient ID: Theresa Hancock, female    DOB: 07-05-1950, 72 y.o.   MRN: [id number]  Chief Complaint  Patient presents with   Follow-up    Referring provider: Ivonne Andrew, NP  [name] is a 72 y.o. female with Past Medical History: No date: Breast cancer [facility])   HPI  Patient presents for a follow up today.  She states that she has been doing well. She is following low-salt diet.   She has followed-up with cardiology since her last visit here.  She is compliant with medications.  Declines labs today states that she has not coming appointment with cardiology and will have labs checked at that visit.  Denies f/c/s, n/v/d, hemoptysis, PND, leg swelling. Denies chest pain or edema.  No Known Allergies  Immunization History  Administered Date(s) Administered   Fluad Quad(high Dose 65+) 02/27/2022   Moderna Covid-19 Vaccine Bivalent Booster 63yrs & up 03/10/2021   PNEUMOCOCCAL CONJUGATE-20 03/28/2022   Pfizer(Comirnaty)Fall Seasonal Vaccine 12 years and older 03/08/2022   Respiratory Syncytial Virus Vaccine,Recomb Aduvanted(Arexvy) 02/01/2022   Zoster Recombinant(Shingrix) 04/21/2022    Tobacco History: Social History   Tobacco Use  Smoking Status Never  Smokeless Tobacco Never   Counseling given: Not Answered   Outpatient Encounter Medications as of 04/02/2023  Medication Sig   amoxicillin-clavulanate (AUGMENTIN) 875-125 MG tablet Take 1 tablet by mouth 2 (two) times daily. (Patient not taking: Reported on 11/16/2022)   atorvastatin (LIPITOR) 40 MG tablet TAKE 1 TABLET BY MOUTH EVERY DAY   carvedilol (COREG) 12.5 MG tablet TAKE 1 TABLET (12.5MG  TOTAL) BY MOUTH TWICE A DAY WITH MEALS   COVID-19 mRNA bivalent vaccine, Moderna, (MODERNA COVID-19 BIVAL BOOSTER) 50 MCG/0.5ML injection Inject into the muscle. (Patient not taking: Reported on 11/16/2022)   COVID-19 mRNA vaccine 2023-2024 (COMIRNATY) SUSP injection Inject into the muscle. (Patient not taking:  Reported on 11/16/2022)   ENTRESTO 24-26 MG TAKE 1 TABLET BY MOUTH TWICE A DAY   FARXIGA 10 MG TABS tablet TAKE 1 TABLET BY MOUTH EVERY DAY   furosemide (LASIX) 40 MG tablet Take 1 tablet (40 mg total) by mouth daily as needed.   influenza vaccine adjuvanted (FLUAD QUADRIVALENT) 0.5 ML injection Inject into the muscle. (Patient not taking: Reported on 11/16/2022)   pneumococcal 20-valent conjugate vaccine (PREVNAR 20) 0.5 ML injection Inject into the muscle. (Patient not taking: Reported on 11/16/2022)   RSV vaccine recomb adjuvanted (AREXVY) 120 MCG/0.5ML injection Inject into the muscle. (Patient not taking: Reported on 11/16/2022)   spironolactone (ALDACTONE) 25 MG tablet TAKE 1 TABLET (25 MG TOTAL) BY MOUTH DAILY.   Zoster Vaccine Adjuvanted [facility]) injection Inject into the muscle. (Patient not taking: Reported on 11/16/2022)   No facility-administered encounter medications on file as of 04/02/2023.    Review of Systems  Review of Systems  Constitutional: Negative.   HENT: Negative.    Cardiovascular: Negative.   Gastrointestinal: Negative.   Allergic/Immunologic: Negative.   Neurological: Negative.   Psychiatric/Behavioral: Negative.       Objective:   BP 132/60   Pulse 76   Wt Readings from Last 5 Encounters:  11/16/22 204 lb 3.2 oz (92.6 kg)  09/28/22 200 lb 12.8 oz (91.1 kg)  07/10/22 200 lb 6.4 oz (90.9 kg)  05/05/22 203 lb (92.1 kg)  03/29/22 204 lb (92.5 kg)     Physical Exam Vitals and nursing note reviewed.  Constitutional:      General: She is not in acute distress.  Appearance: She is well-developed.  Cardiovascular:     Rate and Rhythm: Normal rate and regular rhythm.  Pulmonary:     Effort: Pulmonary effort is normal.     Breath sounds: Normal breath sounds.  Neurological:     Mental Status: She is alert and oriented to person, place, and time.       Assessment & Plan:   Primary hypertension     Return in about 6 months (around 09/30/2023).    Ivonne Andrew, NP 04/02/2023

## 2023-04-27 ENCOUNTER — Other Ambulatory Visit (HOSPITAL_BASED_OUTPATIENT_CLINIC_OR_DEPARTMENT_OTHER): Payer: Self-pay

## 2023-04-27 MED ORDER — COMIRNATY 30 MCG/0.3ML IM SUSY
0.3000 mL | PREFILLED_SYRINGE | Freq: Once | INTRAMUSCULAR | 0 refills | Status: AC
  Filled 2023-04-27: qty 0.3, 1d supply, fill #0

## 2023-04-27 MED ORDER — FLUAD 0.5 ML IM SUSY
0.5000 mL | PREFILLED_SYRINGE | Freq: Once | INTRAMUSCULAR | 0 refills | Status: AC
  Filled 2023-04-27: qty 0.5, 1d supply, fill #0

## 2023-05-02 ENCOUNTER — Ambulatory Visit (HOSPITAL_COMMUNITY): Payer: BC Managed Care – PPO

## 2023-05-15 ENCOUNTER — Encounter: Payer: Self-pay | Admitting: Cardiology

## 2023-05-15 ENCOUNTER — Ambulatory Visit: Payer: BC Managed Care – PPO | Attending: Cardiology | Admitting: Cardiology

## 2023-05-15 VITALS — BP 120/64 | HR 66 | Ht 62.0 in | Wt 211.0 lb

## 2023-05-15 DIAGNOSIS — I10 Essential (primary) hypertension: Secondary | ICD-10-CM | POA: Diagnosis not present

## 2023-05-15 DIAGNOSIS — E785 Hyperlipidemia, unspecified: Secondary | ICD-10-CM | POA: Diagnosis not present

## 2023-05-15 DIAGNOSIS — I5022 Chronic systolic (congestive) heart failure: Secondary | ICD-10-CM | POA: Diagnosis not present

## 2023-05-15 DIAGNOSIS — R400 Somnolence: Secondary | ICD-10-CM

## 2023-05-15 NOTE — Progress Notes (Signed)
Cardiology Office Note:    Date:  05/15/2023   ID:  [name], DOB 1951-05-16, MRN [id number]  PCP:  Ivonne Andrew, NP  Cardiologist:  Little Ishikawa, MD  Electrophysiologist:  None   Referring MD: Ivonne Andrew, NP   Chief Complaint  Patient presents with   Congestive Heart Failure    History of Present Illness:    Theresa Hancock is a 72 y.o. female with a hx of HFrEF (EF 40 to 45%), hypertension, breast cancer who presents for follow-up.  She was admitted 09/2021 with dyspnea.  Echo 10/14/2021 showed EF 40 to 45%.  LHC showed normal coronary arteries.  She received IV diuresis and was discharged on p.o. Lasix 40 mg daily.  Was severely hypertensive on admission, initially required nitroglycerin drip.  She was transitioned to [employer], spironolactone, and Farxiga on discharge.  Cardiac MRI 07/05/2022 showed LVEF 54%, RVEF 61%, no LGE.  Since last clinic visit, she reports she is doing well.  Denies any chest pain, dyspnea, lightheadedness, syncope, lower extremity edema, or palpitations.   Wt Readings from Last 3 Encounters:  05/15/23 211 lb (95.7 kg)  11/16/22 204 lb 3.2 oz (92.6 kg)  09/28/22 200 lb 12.8 oz (91.1 kg)    BP Readings from Last 3 Encounters:  05/15/23 120/64  04/02/23 132/60  11/16/22 (!) 122/53      Past Medical History:  Diagnosis Date   Breast cancer [facility])     Past Surgical History:  Procedure Laterality Date   BILATERAL TOTAL MASTECTOMY WITH AXILLARY LYMPH NODE DISSECTION Bilateral 1999   LAPAROSCOPIC TOTAL HYSTERECTOMY Bilateral 1999   RIGHT/LEFT HEART CATH AND CORONARY ANGIOGRAPHY N/A 10/14/2021   Procedure: RIGHT/LEFT HEART CATH AND CORONARY ANGIOGRAPHY;  Surgeon: Marykay Lex, MD;  Location: [facility] INVASIVE CV LAB;  Service: Cardiovascular;  Laterality: N/A;    Current Medications: Current Meds  Medication Sig   atorvastatin (LIPITOR) 40 MG tablet TAKE 1 TABLET BY MOUTH EVERY DAY   carvedilol (COREG) 12.5 MG  tablet TAKE 1 TABLET (12.5MG  TOTAL) BY MOUTH TWICE A DAY WITH MEALS   COVID-19 mRNA bivalent vaccine, Moderna, (MODERNA COVID-19 BIVAL BOOSTER) 50 MCG/0.5ML injection Inject into the muscle.   COVID-19 mRNA vaccine 2023-2024 (COMIRNATY) SUSP injection Inject into the muscle.   ENTRESTO 24-26 MG TAKE 1 TABLET BY MOUTH TWICE A DAY   FARXIGA 10 MG TABS tablet TAKE 1 TABLET BY MOUTH EVERY DAY   influenza vaccine adjuvanted (FLUAD QUADRIVALENT) 0.5 ML injection Inject into the muscle.   pneumococcal 20-valent conjugate vaccine (PREVNAR 20) 0.5 ML injection Inject into the muscle.   RSV vaccine recomb adjuvanted (AREXVY) 120 MCG/0.5ML injection Inject into the muscle.   spironolactone (ALDACTONE) 25 MG tablet TAKE 1 TABLET (25 MG TOTAL) BY MOUTH DAILY.   Zoster Vaccine Adjuvanted [facility]) injection Inject into the muscle.     Allergies:   Patient has no known allergies.   Social History   Socioeconomic History   Marital status: Single    Spouse name: Not on file   Number of children: Not on file   Years of education: Not on file   Highest education level: Not on file  Occupational History   Not on file  Tobacco Use   Smoking status: Never   Smokeless tobacco: Never  Vaping Use   Vaping status: Never Used  Substance and Sexual Activity   Alcohol use: Yes    Alcohol/week: 1.0 standard drink of alcohol    Types: 1  Glasses of wine per week    Comment: 1/2 every to to 3 days   Drug use: Never   Sexual activity: Not Currently  Other Topics Concern   Not on file  Social History Narrative   Not on file   Social Drivers of Health   Financial Resource Strain: Not on file  Food Insecurity: Not on file  Transportation Needs: Not on file  Physical Activity: Not on file  Stress: Not on file  Social Connections: Not on file     Family History: The patient's family history includes Breast cancer in her mother; Heart attack in her father; Heart failure in her brother.  ROS:   Please  see the history of present illness.     All other systems reviewed and are negative.  EKGs/Labs/Other Studies Reviewed:    The following studies were reviewed today:   EKG:   03/29/22: NSR, rate 79, LBBB 11/16/22: Normal sinus rhythm, rate 74, left bundle branch block 05/15/23: NSR, LBBB, rate 66  Recent Labs: 11/16/2022: BUN 14; Creatinine, Ser 0.59; Magnesium 2.4; Potassium 4.4; Sodium 141  Recent Lipid Panel    Component Value Date/Time   CHOL 182 03/29/2022 1422   TRIG 91 03/29/2022 1422   HDL 67 03/29/2022 1422   CHOLHDL 2.7 03/29/2022 1422   CHOLHDL 2.7 10/13/2021 1159   VLDL 15 10/13/2021 1159   LDLCALC 99 03/29/2022 1422    Physical Exam:    VS:  BP 120/64   Pulse 66   Ht 5\' 2"  (1.575 m)   Wt 211 lb (95.7 kg)   SpO2 94%   BMI 38.59 kg/m     Wt Readings from Last 3 Encounters:  05/15/23 211 lb (95.7 kg)  11/16/22 204 lb 3.2 oz (92.6 kg)  09/28/22 200 lb 12.8 oz (91.1 kg)     GEN:  Well nourished, well developed in no acute distress HEENT: Normal NECK: No JVD; No carotid bruits CARDIAC: RRR, no murmurs, rubs, gallops RESPIRATORY:  Clear to auscultation without rales, wheezing or rhonchi  ABDOMEN: Soft, non-tender, non-distended MUSCULOSKELETAL:  No edema; No deformity  SKIN: Warm and dry NEUROLOGIC:  Alert and oriented x 3 PSYCHIATRIC:  Normal affect   ASSESSMENT:    1. Chronic systolic heart failure (HCC)   2. Essential hypertension   3. Hyperlipidemia, unspecified hyperlipidemia type   4. Daytime somnolence      PLAN:    Chronic systolic heart failure: admitted 09/2021 with dyspnea.  Echo 10/14/2021 showed EF 40 to 45%.  LHC 10/14/2021 showed normal coronary arteries.  Markedly hypertensive on admission, suspect heart failure secondary to uncontrolled hypertension.  Cardiac MRI 07/05/2022 showed LVEF 54%, RVEF 61%, no LGE. - Appears euvolemic, on Lasix to 40 mg daily as needed.  Recommend monitor daily weights and can take if gains within 3 pounds in  1 day or 5 pounds in 1 week.  Check BMET, magnesium -Continue Entresto 24-26 mg twice daily -Continue Coreg 12.5 mg twice daily -Continue spironolactone 25 mg daily -Continue Farxiga 10 mg daily -Update echocardiogram   Hypertension: Continue Entresto, Coreg, spironolactone as above  Hyperlipidemia: Continue Lipitor 40 mg daily, LDL 99 on 03/29/2022  Daytime somnolence: Check Itamar sleep study,was given to her at last clinic visit but did not wear.  Will d/w sleep coordinator.  STOP-BANG 4  RTC in 6 months  Medication Adjustments/Labs and Tests Ordered: Current medicines are reviewed at length with the patient today.  Concerns regarding medicines are outlined above.  Orders Placed  This Encounter  Procedures   Basic Metabolic Panel (BMET)   CBC w/Diff/Platelet   Magnesium   EKG 12-Lead   No orders of the defined types were placed in this encounter.   Patient Instructions  Medication Instructions:  Continue current medications *If you need a refill on your cardiac medications before your next appointment, please call your pharmacy*   Lab Work: Bmet, cbc, mg today If you have labs (blood work) drawn today and your tests are completely normal, you will receive your results only by: MyChart Message (if you have MyChart) OR A paper copy in the mail If you have any lab test that is abnormal or we need to change your treatment, we will call you to review the results.   Testing/Procedures: none   Follow-Up: At [facility], you and your health needs are our priority.  As part of our continuing mission to provide you with exceptional heart care, we have created designated Provider Care Teams.  These Care Teams include your primary Cardiologist (physician) and Advanced Practice Providers (APPs -  Physician Assistants and Nurse Practitioners) who all work together to provide you with the care you need, when you need it.  We recommend signing up for the patient portal  called "MyChart".  Sign up information is provided on this After Visit Summary.  MyChart is used to connect with patients for Virtual Visits (Telemedicine).  Patients are able to view lab/test results, encounter notes, upcoming appointments, etc.  Non-urgent messages can be sent to your provider as well.   To learn more about what you can do with MyChart, go to [email or web address].    Your next appointment:   6 month(s)  Provider:   Little Ishikawa, MD     Other Instructions none        Signed, Little Ishikawa, MD  05/15/2023 5:27 PM    [facility]

## 2023-05-15 NOTE — Patient Instructions (Signed)
Medication Instructions:  Continue current medications *If you need a refill on your cardiac medications before your next appointment, please call your pharmacy*   Lab Work: Bmet, cbc, mg today If you have labs (blood work) drawn today and your tests are completely normal, you will receive your results only by: MyChart Message (if you have MyChart) OR A paper copy in the mail If you have any lab test that is abnormal or we need to change your treatment, we will call you to review the results.   Testing/Procedures: none   Follow-Up: At [facility], you and your health needs are our priority.  As part of our continuing mission to provide you with exceptional heart care, we have created designated Provider Care Teams.  These Care Teams include your primary Cardiologist (physician) and Advanced Practice Providers (APPs -  Physician Assistants and Nurse Practitioners) who all work together to provide you with the care you need, when you need it.  We recommend signing up for the patient portal called "MyChart".  Sign up information is provided on this After Visit Summary.  MyChart is used to connect with patients for Virtual Visits (Telemedicine).  Patients are able to view lab/test results, encounter notes, upcoming appointments, etc.  Non-urgent messages can be sent to your provider as well.   To learn more about what you can do with MyChart, go to [email or web address].    Your next appointment:   6 month(s)  Provider:   Little Ishikawa, MD     Other Instructions none

## 2023-05-16 LAB — CBC WITH DIFFERENTIAL/PLATELET
Basophils Absolute: 0.1 10*3/uL (ref 0.0–0.2)
Basos: 1 %
EOS (ABSOLUTE): 0.2 10*3/uL (ref 0.0–0.4)
Eos: 2 %
Hematocrit: 49.7 % — ABNORMAL HIGH (ref 34.0–46.6)
Hemoglobin: 16.6 g/dL — ABNORMAL HIGH (ref 11.1–15.9)
Immature Grans (Abs): 0 10*3/uL (ref 0.0–0.1)
Immature Granulocytes: 0 %
Lymphocytes Absolute: 3.6 10*3/uL — ABNORMAL HIGH (ref 0.7–3.1)
Lymphs: 36 %
MCH: 31.9 pg (ref 26.6–33.0)
MCHC: 33.4 g/dL (ref 31.5–35.7)
MCV: 95 fL (ref 79–97)
Monocytes Absolute: 0.7 10*3/uL (ref 0.1–0.9)
Monocytes: 7 %
Neutrophils Absolute: 5.6 10*3/uL (ref 1.4–7.0)
Neutrophils: 54 %
Platelets: 273 10*3/uL (ref 150–450)
RBC: 5.21 x10E6/uL (ref 3.77–5.28)
RDW: 12.4 % (ref 11.7–15.4)
WBC: 10.2 10*3/uL (ref 3.4–10.8)

## 2023-05-16 LAB — BASIC METABOLIC PANEL
BUN/Creatinine Ratio: 25 (ref 12–28)
BUN: 14 mg/dL (ref 8–27)
CO2: 20 mmol/L (ref 20–29)
Calcium: 9.9 mg/dL (ref 8.7–10.3)
Chloride: 101 mmol/L (ref 96–106)
Creatinine, Ser: 0.57 mg/dL (ref 0.57–1.00)
Glucose: 95 mg/dL (ref 70–99)
Potassium: 4.7 mmol/L (ref 3.5–5.2)
Sodium: 140 mmol/L (ref 134–144)
eGFR: 98 mL/min/{1.73_m2} (ref >59)

## 2023-05-16 LAB — MAGNESIUM: Magnesium: 2.3 mg/dL (ref 1.6–2.3)

## 2023-05-21 ENCOUNTER — Other Ambulatory Visit: Payer: Self-pay | Admitting: Cardiology

## 2023-06-04 ENCOUNTER — Ambulatory Visit (HOSPITAL_COMMUNITY): Payer: BC Managed Care – PPO | Attending: Cardiology

## 2023-06-04 DIAGNOSIS — I5022 Chronic systolic (congestive) heart failure: Secondary | ICD-10-CM | POA: Insufficient documentation

## 2023-06-04 LAB — ECHOCARDIOGRAM COMPLETE
Area-P 1/2: 3.72 cm2
S' Lateral: 2.85 cm

## 2023-06-06 ENCOUNTER — Ambulatory Visit (INDEPENDENT_AMBULATORY_CARE_PROVIDER_SITE_OTHER): Payer: BC Managed Care – PPO | Admitting: Podiatry

## 2023-06-06 ENCOUNTER — Encounter: Payer: Self-pay | Admitting: Podiatry

## 2023-06-06 DIAGNOSIS — M79675 Pain in left toe(s): Secondary | ICD-10-CM | POA: Diagnosis not present

## 2023-06-06 DIAGNOSIS — M79674 Pain in right toe(s): Secondary | ICD-10-CM | POA: Diagnosis not present

## 2023-06-06 DIAGNOSIS — B351 Tinea unguium: Secondary | ICD-10-CM | POA: Diagnosis not present

## 2023-06-06 NOTE — Progress Notes (Signed)
 Subjective: Theresa Hancock presents today for with chief concern of diabetes with elongated, thickened, painful, discolored toenails for months. Aggravating factor(s) include wearing enclosed shoe gear. Patient has not attempted treatment. Chief Complaint  Patient presents with   RFC    RM#17 RFC patient has no concerns today just need nail trim.   Patient denies any h/o foot wounds.  PCP is Theresa Bascom RAMAN, Theresa Hancock .  Past Medical History:  Diagnosis Date   Acute systolic heart failure (HCC)    Breast cancer (HCC)    Chronic systolic heart failure (HCC) 06/11/2023   Demand ischemia (HCC)    Hyperlipidemia 11/03/2021   Non-ischemic cardiomyopathy (HCC) 11/03/2021   Primary hypertension 11/03/2021    Patient Active Problem List   Diagnosis Date Noted   Chronic systolic heart failure (HCC) 06/11/2023   Fatigue 06/11/2023   Need for shingles vaccine 06/11/2023   Non-ischemic cardiomyopathy (HCC) 11/03/2021   Primary hypertension 11/03/2021   Pneumonia of both lungs due to infectious organism 11/03/2021   Hyperlipidemia 11/03/2021   Acute respiratory failure with hypoxia (HCC)    Demand ischemia (HCC)    Hypertensive urgency 10/13/2021   Acute systolic heart failure (HCC)    Elevated troponin     Past Surgical History:  Procedure Laterality Date   BILATERAL TOTAL MASTECTOMY WITH AXILLARY LYMPH NODE DISSECTION Bilateral 1999   LAPAROSCOPIC TOTAL HYSTERECTOMY Bilateral 1999   RIGHT/LEFT HEART CATH AND CORONARY ANGIOGRAPHY N/A 10/14/2021   Procedure: RIGHT/LEFT HEART CATH AND CORONARY ANGIOGRAPHY;  Surgeon: Anner Alm ORN, MD;  Location: [facility] INVASIVE CV LAB;  Service: Cardiovascular;  Laterality: N/A;    Current Outpatient Medications on File Prior to Visit  Medication Sig Dispense Refill   atorvastatin  (LIPITOR ) 40 MG tablet TAKE 1 TABLET BY MOUTH EVERY DAY 30 tablet 10   carvedilol  (COREG ) 12.5 MG tablet TAKE 1 TABLET (12.5MG  TOTAL) BY MOUTH TWICE A DAY WITH MEALS 60  tablet 4   FARXIGA  10 MG TABS tablet TAKE 1 TABLET BY MOUTH EVERY DAY 30 tablet 5   furosemide  (LASIX ) 40 MG tablet Take 1 tablet (40 mg total) by mouth daily as needed. 30 tablet 5   sacubitril -valsartan  (ENTRESTO ) 24-26 MG TAKE 1 TABLET BY MOUTH TWICE A DAY 60 tablet 11   spironolactone  (ALDACTONE ) 25 MG tablet TAKE 1 TABLET (25 MG TOTAL) BY MOUTH DAILY. 30 tablet 10   No current facility-administered medications on file prior to visit.     No Known Allergies  Social History   Occupational History   Not on file  Tobacco Use   Smoking status: Never   Smokeless tobacco: Never  Vaping Use   Vaping status: Never Used  Substance and Sexual Activity   Alcohol use: Yes    Alcohol/week: 1.0 standard drink of alcohol    Types: 1 Glasses of wine per week    Comment: 1/2 every to to 3 days   Drug use: Never   Sexual activity: Not Currently    Family History  Problem Relation Age of Onset   Breast cancer Mother    Heart attack Father    Heart failure Brother     Immunization History  Administered Date(s) Administered   Fluad  Quad(high Dose 65+) 02/27/2022   Fluad  Trivalent(High Dose 65+) 04/27/2023   Moderna Covid-19 Vaccine Bivalent Booster 64yrs & up 03/10/2021   PNEUMOCOCCAL CONJUGATE-20 03/28/2022   Pfizer(Comirnaty )Fall Seasonal Vaccine 12 years and older 03/08/2022, 04/27/2023   Respiratory Syncytial Virus Vaccine ,Recomb Aduvanted(Arexvy ) 02/01/2022   Zoster Recombinant(Shingrix )  04/21/2022    Objective: There were no vitals filed for this visit.  Theresa Hancock is a pleasant 73 y.o. female WD, WN in NAD. AAO X 3.  Vascular Examination: Capillary refill time immediate b/l. Vascular status intact b/l with palpable pedal pulses. Pedal hair present b/l. No pain with calf compression b/l. Skin temperature gradient WNL b/l. No cyanosis or clubbing b/l. No ischemia or gangrene noted b/l.   Neurological Examination: Sensation grossly intact b/l with 10 gram monofilament.    Dermatological Examination: Pedal skin with normal turgor, texture and tone b/l.  No open wounds. No interdigital macerations.   Toenails 1-5 b/l thick, discolored, elongated with subungual debris and pain on dorsal palpation.   No hyperkeratotic nor porokeratotic lesions present on today's visit.  Musculoskeletal Examination: Normal muscle strength 5/5 to all lower extremity muscle groups bilaterally. No pain, crepitus or joint limitation noted with ROM b/l LE. No gross bony pedal deformities b/l. Patient ambulates independently without assistive aids.  Radiographs: None  Lab Results  Component Value Date   HGBA1C 5.5 10/13/2021   Assessment: 1. Pain due to onychomycosis of toenails of both feet     Plan: Patient was evaluated and treated. All patient's and/or POA's questions/concerns addressed on today's visit. Toenails 1-5 debrided in length and girth without incident. Continue soft, supportive shoe gear daily. Report any pedal injuries to medical professional. Call office if there are any questions/concerns. -Patient/POA to call should there be question/concern in the interim.  Return in about 3 months (around 09/04/2023).  Delon LITTIE Merlin, DPM      [facility] LOCATION: [address] [place] [address]                   Office [phone]   [facility] LOCATION: [address] [place], [place] [address] Office [phone]

## 2023-06-07 DIAGNOSIS — H35363 Drusen (degenerative) of macula, bilateral: Secondary | ICD-10-CM | POA: Diagnosis not present

## 2023-06-07 DIAGNOSIS — H35373 Puckering of macula, bilateral: Secondary | ICD-10-CM | POA: Diagnosis not present

## 2023-06-07 DIAGNOSIS — H43813 Vitreous degeneration, bilateral: Secondary | ICD-10-CM | POA: Diagnosis not present

## 2023-06-07 DIAGNOSIS — H25813 Combined forms of age-related cataract, bilateral: Secondary | ICD-10-CM | POA: Diagnosis not present

## 2023-06-11 ENCOUNTER — Ambulatory Visit: Payer: Self-pay | Admitting: Nurse Practitioner

## 2023-06-11 ENCOUNTER — Encounter: Payer: Self-pay | Admitting: Nurse Practitioner

## 2023-06-11 VITALS — BP 141/53 | HR 78 | Temp 97.0°F | Ht 62.0 in | Wt 214.0 lb

## 2023-06-11 DIAGNOSIS — R5383 Other fatigue: Secondary | ICD-10-CM | POA: Insufficient documentation

## 2023-06-11 DIAGNOSIS — I5022 Chronic systolic (congestive) heart failure: Secondary | ICD-10-CM | POA: Insufficient documentation

## 2023-06-11 DIAGNOSIS — Z23 Encounter for immunization: Secondary | ICD-10-CM | POA: Insufficient documentation

## 2023-06-11 HISTORY — DX: Chronic systolic (congestive) heart failure: I50.22

## 2023-06-11 NOTE — Assessment & Plan Note (Addendum)
 EKG done in the office today shows NSR rate of 70bpm LBBB which is consistent with her previous EKG. Most recent echocardiogram shows right ventricular systolic function normal, left ventricular EF 60 to 65% Patient told to decrease exercises to 20 minutes 5 days a week and gradually increase her exercises as tolerated Checking labs  - CBC; Future - CMP14+EGFR; Future - TSH; Future - VITAMIN D  25 Hydroxy (Vit-D Deficiency, Fractures); Future

## 2023-06-11 NOTE — Patient Instructions (Signed)
 Lest reduce exercise to 20 minutes 5 days a week and gradually increase it to 30 minutes 5 days a week     1. Fatigue, unspecified type (Primary)  - CBC; Future - CMP14+EGFR; Future - TSH; Future - VITAMIN D  25 Hydroxy (Vit-D Deficiency, Fractures); Future - EKG 12-Lead   It is important that you exercise regularly at least 30 minutes 5 times a week as tolerated  Think about what you will eat, plan ahead. Choose  clean, green, fresh or frozen over canned, processed or packaged foods which are more sugary, salty and fatty. 70 to 75% of food eaten should be vegetables and fruit. Three meals at set times with snacks allowed between meals, but they must be fruit or vegetables. Aim to eat over a 12 hour period , example 7 am to 7 pm, and STOP after  your last meal of the day. Drink water,generally about 64 ounces per day, no other drink is as healthy. Fruit juice is best enjoyed in a healthy way, by EATING the fruit.  Thanks for choosing [facility] we consider it a privelige to serve you.

## 2023-06-11 NOTE — Progress Notes (Signed)
 Acute Office Visit  Subjective:     Patient ID: Theresa Hancock, female    DOB: Jan 19, 1951, 73 y.o.   MRN: [id number]  Chief Complaint  Patient presents with   Fatigue    For two weeks no issues with sleep    HPI Theresa Hancock  has a past medical history of Acute systolic heart failure (HCC), Breast cancer [facility]), Chronic systolic heart failure (HCC) (98/86/7974), Demand ischemia (HCC), Hyperlipidemia (11/03/2021), Non-ischemic cardiomyopathy (HCC) (11/03/2021), and Primary hypertension (11/03/2021).  Patient presents with complaints of extreme fatigue, lack of energy for the past 2 weeks.  States that she sleeps 6 to 8 hours nightly.  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  She has started doing walking exercises 40 to 45 minutes daily since her mother passed.  Patient denies fever, chills, loss of appetite, chest pain, shortness of breath, edema, depression, anxiety. Had an EKG and echocardiogram done recently that were normal   Patient is in today for extreeem fatigue, lack of enery. 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 Low enery to keep going through out the day , was a caregiver for mother that passed in [place]. Walking 40-45  minutes a day . Started at 20 minutes . No fever , no chills. No SOB, chest pain    Review of Systems  Constitutional:  Positive for fatigue. Negative for appetite change, chills and fever.  HENT:  Negative for congestion, postnasal drip, rhinorrhea and sneezing.   Respiratory:  Negative for cough, shortness of breath and wheezing.   Cardiovascular:  Negative for chest pain, palpitations and leg swelling.  Gastrointestinal:  Negative for abdominal pain, constipation, nausea and vomiting.  Genitourinary:  Negative for difficulty urinating, dysuria, flank pain and frequency.  Musculoskeletal:  Negative for arthralgias, back pain, joint swelling and myalgias.  Skin:  Negative for color change,  pallor, rash and wound.  Neurological:  Negative for dizziness, facial asymmetry, weakness, numbness and headaches.  Psychiatric/Behavioral:  Negative for behavioral problems, confusion, self-injury and suicidal ideas.         Objective:    BP (!) 141/53   Pulse 78   Temp (!) 97 F (36.1 C)   Ht 5' 2 (1.575 m)   Wt 214 lb (97.1 kg)   SpO2 98%   BMI 39.14 kg/m    Physical Exam Vitals and nursing note reviewed.  Constitutional:      General: She is not in acute distress.    Appearance: Normal appearance. She is obese. She is not ill-appearing, toxic-appearing or diaphoretic.  HENT:     Mouth/Throat:     Mouth: Mucous membranes are moist.     Pharynx: Oropharynx is clear. No oropharyngeal exudate or posterior oropharyngeal erythema.  Eyes:     General: No scleral icterus.       Right eye: No discharge.        Left eye: No discharge.     Extraocular Movements: Extraocular movements intact.     Conjunctiva/sclera: Conjunctivae normal.  Cardiovascular:     Rate and Rhythm: Normal rate and regular rhythm.     Pulses: Normal pulses.     Heart sounds: Normal heart sounds. No murmur heard.    No friction rub. No gallop.  Pulmonary:     Effort: Pulmonary effort is normal. No respiratory distress.     Breath sounds: Normal breath sounds. No stridor. No wheezing, rhonchi or rales.  Chest:  Chest wall: No tenderness.  Abdominal:     General: There is no distension.     Palpations: Abdomen is soft.     Tenderness: There is no abdominal tenderness. There is no right CVA tenderness, left CVA tenderness or guarding.  Musculoskeletal:        General: No swelling, tenderness, deformity or signs of injury.     Right lower leg: No edema.     Left lower leg: No edema.  Skin:    General: Skin is warm and dry.     Capillary Refill: Capillary refill takes less than 2 seconds.     Coloration: Skin is not jaundiced or pale.     Findings: No bruising, erythema or lesion.   Neurological:     Mental Status: She is alert and oriented to person, place, and time.     Motor: No weakness.     Coordination: Coordination normal.     Gait: Gait normal.  Psychiatric:        Mood and Affect: Mood normal.        Behavior: Behavior normal.        Thought Content: Thought content normal.        Judgment: Judgment normal.     No results found for any visits on 06/11/23.      Assessment & Plan:   Problem List Items Addressed This Visit       Cardiovascular and Mediastinum   Chronic systolic heart failure (HCC)   BP Readings from Last 3 Encounters:  06/11/23 (!) 141/53  05/15/23 120/64  04/02/23 132/60  Most recent echocardiogram shows right ventricular systolic function normal, left ventricular EF 60 to 65% Systolic BP is elevated in the office today but has been previously well-controlled States that her blood pressure at home has been normal Continue Entresto  24-26 1 tablet twice daily, furosemide  40 mg as needed, carvedilol  12.5 mg twice daily, Farxiga  10 mg daily spironolactone  25 mg daily        Other   Fatigue - Primary     EKG done in the office today shows NSR rate of 70bpm LBBB which is consistent with her previous EKG. Most recent echocardiogram shows right ventricular systolic function normal, left ventricular EF 60 to 65% Patient told to decrease exercises to 20 minutes 5 days a week and gradually increase her exercises as tolerated Checking labs  - CBC; Future - CMP14+EGFR; Future - TSH; Future - VITAMIN D  25 Hydroxy (Vit-D Deficiency, Fractures); Future         Relevant Orders   CBC   CMP14+EGFR   TSH   VITAMIN D  25 Hydroxy (Vit-D Deficiency, Fractures)   EKG 12-Lead    No orders of the defined types were placed in this encounter.   No follow-ups on file.  Johnavon Mcclafferty R Mylene Bow, FNP

## 2023-06-11 NOTE — Assessment & Plan Note (Deleted)
 Patient educated on CDC recommendation for the vaccine. Verbal consent was obtained from the patient, vaccine administered by nurse, no sign of adverse reactions noted at this time. Patient education on arm soreness and use of tylenol  for this patient  was discussed. Patient educated on the signs and symptoms of adverse effect and advise to contact the office if they occur.  ?

## 2023-06-11 NOTE — Assessment & Plan Note (Addendum)
 BP Readings from Last 3 Encounters:  06/11/23 (!) 141/53  05/15/23 120/64  04/02/23 132/60  Most recent echocardiogram shows right ventricular systolic function normal, left ventricular EF 60 to 65% Systolic BP is elevated in the office today but has been previously well-controlled States that her blood pressure at home has been normal Continue Entresto  24-26 1 tablet twice daily, furosemide  40 mg as needed, carvedilol  12.5 mg twice daily, Farxiga  10 mg daily spironolactone  25 mg daily

## 2023-06-13 ENCOUNTER — Other Ambulatory Visit: Payer: Self-pay

## 2023-06-13 DIAGNOSIS — R5383 Other fatigue: Secondary | ICD-10-CM | POA: Diagnosis not present

## 2023-06-13 DIAGNOSIS — E559 Vitamin D deficiency, unspecified: Secondary | ICD-10-CM | POA: Diagnosis not present

## 2023-06-14 LAB — CMP14+EGFR
ALT: 26 [IU]/L (ref 0–32)
AST: 16 [IU]/L (ref 0–40)
Albumin: 4.6 g/dL (ref 3.9–4.9)
Alkaline Phosphatase: 106 [IU]/L (ref 44–121)
BUN/Creatinine Ratio: 13 (ref 12–28)
BUN: 17 mg/dL (ref 8–27)
Bilirubin Total: 0.6 mg/dL (ref 0.0–1.2)
CO2: 26 mmol/L (ref 20–29)
Calcium: 9.8 mg/dL (ref 8.7–10.3)
Chloride: 101 mmol/L (ref 96–106)
Creatinine, Ser: 1.33 mg/dL — ABNORMAL HIGH (ref 0.57–1.00)
Globulin, Total: 2.3 g/dL (ref 1.5–4.5)
Glucose: 118 mg/dL — ABNORMAL HIGH (ref 70–99)
Potassium: 4.5 mmol/L (ref 3.5–5.2)
Sodium: 140 mmol/L (ref 134–144)
Total Protein: 6.9 g/dL (ref 6.0–8.5)
eGFR: 43 mL/min/{1.73_m2} — ABNORMAL LOW (ref >59)

## 2023-06-14 LAB — CBC
Hematocrit: 46.9 % — ABNORMAL HIGH (ref 34.0–46.6)
Hemoglobin: 15.7 g/dL (ref 11.1–15.9)
MCH: 31.8 pg (ref 26.6–33.0)
MCHC: 33.5 g/dL (ref 31.5–35.7)
MCV: 95 fL (ref 79–97)
Platelets: 282 10*3/uL (ref 150–450)
RBC: 4.93 x10E6/uL (ref 3.77–5.28)
RDW: 12.2 % (ref 11.7–15.4)
WBC: 8.7 10*3/uL (ref 3.4–10.8)

## 2023-06-14 LAB — TSH: TSH: 2.03 u[IU]/mL (ref 0.450–4.500)

## 2023-06-14 LAB — VITAMIN D 25 HYDROXY (VIT D DEFICIENCY, FRACTURES): Vit D, 25-Hydroxy: 17.2 ng/mL — ABNORMAL LOW (ref 30.0–100.0)

## 2023-06-15 ENCOUNTER — Telehealth: Payer: Self-pay | Admitting: *Deleted

## 2023-06-15 ENCOUNTER — Other Ambulatory Visit: Payer: Self-pay | Admitting: Nurse Practitioner

## 2023-06-15 DIAGNOSIS — I10 Essential (primary) hypertension: Secondary | ICD-10-CM

## 2023-06-15 DIAGNOSIS — N179 Acute kidney failure, unspecified: Secondary | ICD-10-CM

## 2023-06-15 DIAGNOSIS — E785 Hyperlipidemia, unspecified: Secondary | ICD-10-CM

## 2023-06-15 NOTE — Telephone Encounter (Signed)
Called and spoke to patient and made patient aware per Dr. Bjorn Pippin to hold Farxiga 10mg . Hold Spironolactone 10mg  and BMET in one week. Also made patient aware to stay well hydrated. Patient voiced an understanding.

## 2023-06-18 ENCOUNTER — Telehealth: Payer: Self-pay

## 2023-06-18 NOTE — Telephone Encounter (Signed)
Pt was called for labs results no answer. Lvm for pt to call back. KH

## 2023-06-18 NOTE — Telephone Encounter (Signed)
-----   Message from [place] sent at 06/15/2023  6:43 PM EST ----- Not sure if you were able to call the patient regarding her labs and the recommendations.  I tried calling her twice but she did not answer my call.  Hopefully will be able to reach her on Monday. ----- Message ----- From: Interface, [facility] Lab Results In Sent: 06/14/2023   5:37 AM EST To: Donell Beers, FNP

## 2023-06-19 ENCOUNTER — Telehealth: Payer: Self-pay | Admitting: Nurse Practitioner

## 2023-06-19 DIAGNOSIS — H25811 Combined forms of age-related cataract, right eye: Secondary | ICD-10-CM | POA: Diagnosis not present

## 2023-06-19 NOTE — Telephone Encounter (Signed)
Copied from CRM [id number]. Topic: Clinical - Lab/Test Results >> Jun 19, 2023 12:52 PM Geroge Baseman wrote: Reason for CRM: Calling back to reach [facility] about missed call with lab results. Please call new primary number on file for patient as it was just updated.

## 2023-06-20 NOTE — Telephone Encounter (Signed)
Copied from CRM [id number]. Topic: Clinical - Lab/Test Results >> Jun 20, 2023 12:44 PM Victorino Dike T wrote: Reason for CRM: returning call from office about results, please call [phone]

## 2023-06-21 NOTE — Telephone Encounter (Signed)
Pt was advised and she is having labs done Friday at cardio office. Have a great day.   Renelda Loma RMA

## 2023-06-22 DIAGNOSIS — I5021 Acute systolic (congestive) heart failure: Secondary | ICD-10-CM | POA: Diagnosis not present

## 2023-06-22 DIAGNOSIS — E870 Hyperosmolality and hypernatremia: Secondary | ICD-10-CM | POA: Diagnosis not present

## 2023-06-22 DIAGNOSIS — I10 Essential (primary) hypertension: Secondary | ICD-10-CM | POA: Diagnosis not present

## 2023-06-23 ENCOUNTER — Other Ambulatory Visit: Payer: Self-pay | Admitting: Cardiology

## 2023-06-23 LAB — BASIC METABOLIC PANEL
BUN/Creatinine Ratio: 19 (ref 12–28)
BUN: 17 mg/dL (ref 8–27)
CO2: 25 mmol/L (ref 20–29)
Calcium: 9.9 mg/dL (ref 8.7–10.3)
Chloride: 101 mmol/L (ref 96–106)
Creatinine, Ser: 0.9 mg/dL (ref 0.57–1.00)
Glucose: 87 mg/dL (ref 70–99)
Potassium: 4.7 mmol/L (ref 3.5–5.2)
Sodium: 144 mmol/L (ref 134–144)
eGFR: 69 mL/min/{1.73_m2} (ref >59)

## 2023-06-25 ENCOUNTER — Other Ambulatory Visit: Payer: Self-pay | Admitting: *Deleted

## 2023-07-03 DIAGNOSIS — H268 Other specified cataract: Secondary | ICD-10-CM | POA: Diagnosis not present

## 2023-07-03 DIAGNOSIS — H25811 Combined forms of age-related cataract, right eye: Secondary | ICD-10-CM | POA: Diagnosis not present

## 2023-07-03 DIAGNOSIS — I10 Essential (primary) hypertension: Secondary | ICD-10-CM | POA: Diagnosis not present

## 2023-07-09 DIAGNOSIS — H25812 Combined forms of age-related cataract, left eye: Secondary | ICD-10-CM | POA: Diagnosis not present

## 2023-07-24 DIAGNOSIS — H268 Other specified cataract: Secondary | ICD-10-CM | POA: Diagnosis not present

## 2023-07-24 DIAGNOSIS — I10 Essential (primary) hypertension: Secondary | ICD-10-CM | POA: Diagnosis not present

## 2023-07-24 DIAGNOSIS — H25812 Combined forms of age-related cataract, left eye: Secondary | ICD-10-CM | POA: Diagnosis not present

## 2023-07-31 ENCOUNTER — Other Ambulatory Visit: Payer: Self-pay | Admitting: Cardiology

## 2023-10-01 ENCOUNTER — Encounter: Payer: Self-pay | Admitting: Nurse Practitioner

## 2023-10-01 ENCOUNTER — Ambulatory Visit (INDEPENDENT_AMBULATORY_CARE_PROVIDER_SITE_OTHER): Payer: Self-pay | Admitting: Nurse Practitioner

## 2023-10-01 VITALS — BP 152/68 | HR 73 | Temp 98.2°F | Wt 214.0 lb

## 2023-10-01 DIAGNOSIS — I10 Essential (primary) hypertension: Secondary | ICD-10-CM

## 2023-10-01 DIAGNOSIS — E559 Vitamin D deficiency, unspecified: Secondary | ICD-10-CM | POA: Diagnosis not present

## 2023-10-01 NOTE — Progress Notes (Unsigned)
 Subjective   Patient ID: Theresa Hancock, female    DOB: 1951/02/09, 73 y.o.   MRN: [id number]  Chief Complaint  Patient presents with   Medical Management of Chronic Issues    Referring provider: Jerrlyn Morel, NP  Theresa Hancock is a 73 y.o. female with Past Medical History: No date: Acute systolic heart failure (HCC) No date: Breast cancer (HCC) 06/11/2023: Chronic systolic heart failure (HCC) No date: Demand ischemia (HCC) 11/03/2021: Hyperlipidemia 11/03/2021: Non-ischemic cardiomyopathy (HCC) 11/03/2021: Primary hypertension   HPI  Patient presents today for follow-up visit.  Overall she is doing well.  No new issues or concerns today.  She does need a recheck on vitamin D  and routine labs today. Denies f/c/s, n/v/d, hemoptysis, PND, leg swelling Denies chest pain or edema    No Known Allergies  Immunization History  Administered Date(s) Administered   Fluad Quad(high Dose 65+) 02/27/2022   Fluad Trivalent(High Dose 65+) 04/27/2023   Moderna Covid-19 Vaccine Bivalent Booster 2yrs & up 03/10/2021   PNEUMOCOCCAL CONJUGATE-20 03/28/2022   Pfizer(Comirnaty )Fall Seasonal Vaccine 12 years and older 03/08/2022, 04/27/2023   Respiratory Syncytial Virus Vaccine ,Recomb Aduvanted(Arexvy ) 02/01/2022   Zoster Recombinant(Shingrix ) 04/21/2022    Tobacco History: Social History   Tobacco Use  Smoking Status Never  Smokeless Tobacco Never   Counseling given: Not Answered   Outpatient Encounter Medications as of 10/01/2023  Medication Sig   atorvastatin  (LIPITOR ) 40 MG tablet TAKE 1 TABLET BY MOUTH EVERY DAY   carvedilol  (COREG ) 12.5 MG tablet TAKE 1 TABLET (12.5MG  TOTAL) BY MOUTH TWICE A DAY WITH MEALS   Multiple Vitamin (MULTIVITAMIN) tablet Take 1 tablet by mouth in the morning, at noon, and at bedtime.   sacubitril -valsartan  (ENTRESTO ) 24-26 MG TAKE 1 TABLET BY MOUTH TWICE A DAY   furosemide  (LASIX ) 40 MG tablet Take 1 tablet (40 mg total) by mouth daily as  needed. (Patient not taking: Reported on 10/02/2023)   spironolactone  (ALDACTONE ) 25 MG tablet TAKE 1 TABLET (25 MG TOTAL) BY MOUTH DAILY. (Patient not taking: Reported on 10/02/2023)   No facility-administered encounter medications on file as of 10/01/2023.    Review of Systems  Review of Systems  Constitutional: Negative.   HENT: Negative.    Cardiovascular: Negative.   Gastrointestinal: Negative.   Allergic/Immunologic: Negative.   Neurological: Negative.   Psychiatric/Behavioral: Negative.       Objective:   BP (!) 152/68   Pulse 73   Temp 98.2 F (36.8 C) (Oral)   Wt 214 lb (97.1 kg)   SpO2 97%   BMI 39.14 kg/m   Wt Readings from Last 5 Encounters:  10/02/23 214 lb (97.1 kg)  10/01/23 214 lb (97.1 kg)  06/11/23 214 lb (97.1 kg)  05/15/23 211 lb (95.7 kg)  11/16/22 204 lb 3.2 oz (92.6 kg)     Physical Exam Vitals and nursing note reviewed.  Constitutional:      General: She is not in acute distress.    Appearance: She is well-developed.  Cardiovascular:     Rate and Rhythm: Normal rate and regular rhythm.  Pulmonary:     Effort: Pulmonary effort is normal.     Breath sounds: Normal breath sounds.  Neurological:     Mental Status: She is alert and oriented to person, place, and time.       Assessment & Plan:   Vitamin D  deficiency -     VITAMIN D  25 Hydroxy (Vit-D Deficiency, Fractures)  Primary hypertension -  CBC -     Comprehensive metabolic panel with GFR     Return in about 3 months (around 01/01/2024).   Jerrlyn Morel, NP 10/04/2023

## 2023-10-02 ENCOUNTER — Encounter: Payer: Self-pay | Admitting: Podiatry

## 2023-10-02 ENCOUNTER — Ambulatory Visit: Payer: Medicare Other | Admitting: Podiatry

## 2023-10-02 VITALS — Ht 62.0 in | Wt 214.0 lb

## 2023-10-02 DIAGNOSIS — M79675 Pain in left toe(s): Secondary | ICD-10-CM | POA: Diagnosis not present

## 2023-10-02 DIAGNOSIS — B351 Tinea unguium: Secondary | ICD-10-CM | POA: Diagnosis not present

## 2023-10-02 DIAGNOSIS — M79674 Pain in right toe(s): Secondary | ICD-10-CM | POA: Diagnosis not present

## 2023-10-02 LAB — CBC
Hematocrit: 46 % (ref 34.0–46.6)
Hemoglobin: 15.5 g/dL (ref 11.1–15.9)
MCH: 32.1 pg (ref 26.6–33.0)
MCHC: 33.7 g/dL (ref 31.5–35.7)
MCV: 95 fL (ref 79–97)
Platelets: 267 10*3/uL (ref 150–450)
RBC: 4.83 x10E6/uL (ref 3.77–5.28)
RDW: 11.8 % (ref 11.7–15.4)
WBC: 6.9 10*3/uL (ref 3.4–10.8)

## 2023-10-02 LAB — COMPREHENSIVE METABOLIC PANEL WITH GFR
ALT: 24 IU/L (ref 0–32)
AST: 15 IU/L (ref 0–40)
Albumin: 4.3 g/dL (ref 3.9–4.9)
Alkaline Phosphatase: 105 IU/L (ref 44–121)
BUN/Creatinine Ratio: 25 (ref 12–28)
BUN: 13 mg/dL (ref 8–27)
Bilirubin Total: 0.5 mg/dL (ref 0.0–1.2)
CO2: 20 mmol/L (ref 20–29)
Calcium: 9.4 mg/dL (ref 8.7–10.3)
Chloride: 108 mmol/L — ABNORMAL HIGH (ref 96–106)
Creatinine, Ser: 0.52 mg/dL — ABNORMAL LOW (ref 0.57–1.00)
Globulin, Total: 2.4 g/dL (ref 1.5–4.5)
Glucose: 87 mg/dL (ref 70–99)
Potassium: 4 mmol/L (ref 3.5–5.2)
Sodium: 142 mmol/L (ref 134–144)
Total Protein: 6.7 g/dL (ref 6.0–8.5)
eGFR: 100 mL/min/{1.73_m2} (ref >59)

## 2023-10-02 LAB — VITAMIN D 25 HYDROXY (VIT D DEFICIENCY, FRACTURES): Vit D, 25-Hydroxy: 24.7 ng/mL — ABNORMAL LOW (ref 30.0–100.0)

## 2023-10-02 NOTE — Progress Notes (Signed)
  Subjective:  Patient ID: Theresa Hancock, female    DOB: [birth year],  MRN: [id number]  73 y.o. female presents painful elongated mycotic toenails 1-5 bilaterally which are tender when wearing enclosed shoe gear. Pain is relieved with periodic professional debridement. Chief Complaint  Patient presents with   Nail Problem    Pt is here for [facility] PCP is Dr Era Hasty and LOV was yesterday.   New problem(s): None   PCP is Jerrlyn Morel, NP.  No Known Allergies  Review of Systems: Negative except as noted in the HPI.   Objective:  Theresa Hancock is a pleasant 73 y.o. female WD, WN in NAD. AAO x 3.  Vascular Examination: Vascular status intact b/l with palpable pedal pulses. CFT immediate b/l. Pedal hair present. No edema. No pain with calf compression b/l. Skin temperature gradient WNL b/l. No varicosities noted. No cyanosis or clubbing noted.  Neurological Examination: Sensation grossly intact b/l with 10 gram monofilament. Vibratory sensation intact b/l.  Dermatological Examination: Pedal skin with normal turgor, texture and tone b/l. No open wounds nor interdigital macerations noted. Toenails 1-5 b/l thick, discolored, elongated with subungual debris and pain on dorsal palpation. No hyperkeratotic lesions noted b/l.   Musculoskeletal Examination: Muscle strength 5/5 to b/l LE.  No pain, crepitus noted b/l. No gross pedal deformities. Patient ambulates independently without assistive aids.   Radiographs: None  Last A1c:       No data to display           Assessment:   1. Pain due to onychomycosis of toenails of both feet    Plan:  Consent given for treatment. Patient examined. All patient's and/or POA's questions/concerns addressed on today's visit. Mycotic toenails 1-5 debrided in length and girth without incident. Continue soft, supportive shoe gear daily. Report any pedal injuries to medical professional. Call office if there are any quesitons/concerns. -Patient/POA  to call should there be question/concern in the interim.  Return in about 3 months (around 01/02/2024).  Theresa Hancock, DPM      [place] LOCATION: [address] [place] [address]                   Office [phone]   [facility] LOCATION: [address] [place], [place] [address] Office [phone]

## 2023-10-04 ENCOUNTER — Encounter: Payer: Self-pay | Admitting: Nurse Practitioner

## 2023-10-04 NOTE — Patient Instructions (Signed)
 1. Vitamin D  deficiency (Primary)  - Vitamin D , 25-hydroxy  2. Primary hypertension  - CBC - Comprehensive metabolic panel with GFR

## 2023-10-10 ENCOUNTER — Ambulatory Visit: Payer: Self-pay

## 2023-10-30 ENCOUNTER — Ambulatory Visit: Payer: BC Managed Care – PPO | Attending: Cardiology | Admitting: Cardiology

## 2023-10-30 ENCOUNTER — Encounter: Payer: Self-pay | Admitting: Cardiology

## 2023-10-30 VITALS — BP 134/76 | HR 81 | Ht 62.0 in | Wt 218.6 lb

## 2023-10-30 DIAGNOSIS — I10 Essential (primary) hypertension: Secondary | ICD-10-CM

## 2023-10-30 DIAGNOSIS — R400 Somnolence: Secondary | ICD-10-CM | POA: Diagnosis not present

## 2023-10-30 DIAGNOSIS — I5032 Chronic diastolic (congestive) heart failure: Secondary | ICD-10-CM | POA: Diagnosis not present

## 2023-10-30 DIAGNOSIS — E785 Hyperlipidemia, unspecified: Secondary | ICD-10-CM

## 2023-10-30 NOTE — Patient Instructions (Signed)
 Medication Instructions:  Continue current medications *If you need a refill on your cardiac medications before your next appointment, please call your pharmacy*  Lab Work: Bmet, mg today If you have labs (blood work) drawn today and your tests are completely normal, you will receive your results only by: MyChart Message (if you have MyChart) OR A paper copy in the mail If you have any lab test that is abnormal or we need to change your treatment, we will call you to review the results.  Testing/Procedures: none  Follow-Up: At [facility], you and your health needs are our priority.  As part of our continuing mission to provide you with exceptional heart care, our providers are all part of one team.  This team includes your primary Cardiologist (physician) and Advanced Practice Providers or APPs (Physician Assistants and Nurse Practitioners) who all work together to provide you with the care you need, when you need it.  Your next appointment:   6 month(s)  Provider:   Wendie Hamburg, MD    We recommend signing up for the patient portal called "MyChart".  Sign up information is provided on this After Visit Summary.  MyChart is used to connect with patients for Virtual Visits (Telemedicine).  Patients are able to view lab/test results, encounter notes, upcoming appointments, etc.  Non-urgent messages can be sent to your provider as well.   To learn more about what you can do with MyChart, go to [email or web address].   Other Instructions Referral to health weight and wellnes

## 2023-10-30 NOTE — Progress Notes (Signed)
 Cardiology Office Note:    Date:  10/30/2023   ID:  Theresa Hancock, DOB 1951-02-21, MRN [id number]  PCP:  Jerrlyn Morel, NP  Cardiologist:  Wendie Hamburg, MD  Electrophysiologist:  None   Referring MD: Jerrlyn Morel, NP   Chief Complaint  Patient presents with   Congestive Heart Failure    History of Present Illness:    Theresa Hancock is a 73 y.o. female with a hx of HFrEF (EF 40 to 45%), hypertension, breast cancer who presents for follow-up.  She was admitted 09/2021 with dyspnea.  Echo 10/14/2021 showed EF 40 to 45%.  LHC showed normal coronary arteries.  She received IV diuresis and was discharged on p.o. Lasix  40 mg daily.  Was severely hypertensive on admission, initially required nitroglycerin  drip.  She was transitioned to Entresto , Coreg , spironolactone , and Farxiga  on discharge.  Cardiac MRI 07/05/2022 showed LVEF 54%, RVEF 61%, no LGE.  Echo 06/04/23 showed EF 60-65%.  Since last clinic visit, she reports she is doing well.  Denies any chest pain, dyspnea, lightheadedness, syncope, lower extremity edema, or palpitations.  States that she was having headaches and BP was running high, she restarted her spironolactone  about 1 month ago.  Reports headaches resolved.  Has only used Lasix  once in last several months.  Reports she does yard work every evening for exercise.   Wt Readings from Last 3 Encounters:  10/30/23 218 lb 9.6 oz (99.2 kg)  10/02/23 214 lb (97.1 kg)  10/01/23 214 lb (97.1 kg)    BP Readings from Last 3 Encounters:  10/30/23 134/76  10/01/23 (!) 152/68  06/11/23 (!) 141/53      Past Medical History:  Diagnosis Date   Acute systolic heart failure (HCC)    Breast cancer (HCC)    Chronic systolic heart failure (HCC) 06/11/2023   Demand ischemia (HCC)    Hyperlipidemia 11/03/2021   Non-ischemic cardiomyopathy (HCC) 11/03/2021   Primary hypertension 11/03/2021    Past Surgical History:  Procedure Laterality Date   BILATERAL TOTAL  MASTECTOMY WITH AXILLARY LYMPH NODE DISSECTION Bilateral 1999   LAPAROSCOPIC TOTAL HYSTERECTOMY Bilateral 1999   RIGHT/LEFT HEART CATH AND CORONARY ANGIOGRAPHY N/A 10/14/2021   Procedure: RIGHT/LEFT HEART CATH AND CORONARY ANGIOGRAPHY;  Surgeon: Arleen Lacer, MD;  Location: [facility] INVASIVE CV LAB;  Service: Cardiovascular;  Laterality: N/A;    Current Medications: Current Meds  Medication Sig   atorvastatin  (LIPITOR ) 40 MG tablet TAKE 1 TABLET BY MOUTH EVERY DAY   carvedilol  (COREG ) 12.5 MG tablet TAKE 1 TABLET (12.5MG  TOTAL) BY MOUTH TWICE A DAY WITH MEALS   Multiple Vitamin (MULTIVITAMIN) tablet Take 1 tablet by mouth in the morning, at noon, and at bedtime.   sacubitril -valsartan  (ENTRESTO ) 24-26 MG TAKE 1 TABLET BY MOUTH TWICE A DAY     Allergies:   Patient has no known allergies.   Social History   Socioeconomic History   Marital status: Single    Spouse name: Not on file   Number of children: Not on file   Years of education: Not on file   Highest education level: Not on file  Occupational History   Not on file  Tobacco Use   Smoking status: Never   Smokeless tobacco: Never  Vaping Use   Vaping status: Never Used  Substance and Sexual Activity   Alcohol use: Yes    Alcohol/week: 1.0 standard drink of alcohol    Types: 1 Glasses of wine per week    Comment:  1/2 every to to 3 days   Drug use: Never   Sexual activity: Not Currently  Other Topics Concern   Not on file  Social History Narrative   Not on file   Social Drivers of Health   Financial Resource Strain: Not on file  Food Insecurity: No Food Insecurity (10/01/2023)   Hunger Vital Sign    Worried About Running Out of Food in the Last Year: Never true    Ran Out of Food in the Last Year: Never true  Transportation Needs: No Transportation Needs (10/01/2023)   PRAPARE - Administrator, Civil Service (Medical): No    Lack of Transportation (Non-Medical): No  Physical Activity: Not on file  Stress:  Not on file  Social Connections: Not on file     Family History: The patient's family history includes Breast cancer in her mother; Heart attack in her father; Heart failure in her brother.  ROS:   Please see the history of present illness.     All other systems reviewed and are negative.  EKGs/Labs/Other Studies Reviewed:    The following studies were reviewed today:   EKG:   03/29/22: NSR, rate 79, LBBB 11/16/22: Normal sinus rhythm, rate 74, left bundle branch block 05/15/23: NSR, LBBB, rate 66 11/26/2023: Normal sinus rhythm, left bundle branch block, rate 81  Recent Labs: 05/15/2023: Magnesium 2.3 06/13/2023: TSH 2.030 10/01/2023: ALT 24; BUN 13; Creatinine, Ser 0.52; Hemoglobin 15.5; Platelets 267; Potassium 4.0; Sodium 142  Recent Lipid Panel    Component Value Date/Time   CHOL 182 03/29/2022 1422   TRIG 91 03/29/2022 1422   HDL 67 03/29/2022 1422   CHOLHDL 2.7 03/29/2022 1422   CHOLHDL 2.7 10/13/2021 1159   VLDL 15 10/13/2021 1159   LDLCALC 99 03/29/2022 1422    Physical Exam:    VS:  BP 134/76 (BP Location: Left Arm, Patient Position: Sitting, Cuff Size: Large)   Pulse 81   Ht 5\' 2"  (1.575 m)   Wt 218 lb 9.6 oz (99.2 kg)   SpO2 91%   BMI 39.98 kg/m     Wt Readings from Last 3 Encounters:  10/30/23 218 lb 9.6 oz (99.2 kg)  10/02/23 214 lb (97.1 kg)  10/01/23 214 lb (97.1 kg)     GEN:  Well nourished, well developed in no acute distress HEENT: Normal NECK: No JVD; No carotid bruits CARDIAC: RRR, no murmurs, rubs, gallops RESPIRATORY:  Clear to auscultation without rales, wheezing or rhonchi  ABDOMEN: Soft, non-tender, non-distended MUSCULOSKELETAL:  No edema; No deformity  SKIN: Warm and dry NEUROLOGIC:  Alert and oriented x 3 PSYCHIATRIC:  Normal affect   ASSESSMENT:    1. Heart failure with improved ejection fraction (HFimpEF) (HCC)   2. Essential hypertension   3. Morbid obesity (HCC)   4. Daytime somnolence   5. Hyperlipidemia, unspecified  hyperlipidemia type     PLAN:    Heart failure with improved ejection fraction: admitted 09/2021 with dyspnea.  Echo 10/14/2021 showed EF 40 to 45%.  LHC 10/14/2021 showed normal coronary arteries.  Markedly hypertensive on admission, suspect heart failure secondary to uncontrolled hypertension.  Cardiac MRI 07/05/2022 showed LVEF 54%, RVEF 61%, no LGE.  Echo 06/04/23 showed EF 60-65%. - Appears euvolemic, on Lasix  to 40 mg daily as needed.  Recommend monitor daily weights and can take if gains within 3 pounds in 1 day or 5 pounds in 1 week.   -Continue Entresto  24-26 mg twice daily -Continue Coreg  12.5 mg  twice daily -Stopped spironolactone  and jardiance due to AKI 05/2023.  Renal function improved to normal.  She restarted spironolactone  last month due to elevated BP.  Will check BMET  Hypertension: Continue Entresto , Coreg , spironolactone  as above  Hyperlipidemia: Continue Lipitor  40 mg daily, LDL 99 on 03/29/2022  Daytime somnolence: Check Itamar sleep study ,was given to her at prior clinic visit but did not wear.  Will d/w sleep coordinator.  STOP-BANG 4  Morbid obesity: Body mass index is 39.98 kg/m.  Will refer to healthy weight and wellness  RTC in 6 months  Medication Adjustments/Labs and Tests Ordered: Current medicines are reviewed at length with the patient today.  Concerns regarding medicines are outlined above.  Orders Placed This Encounter  Procedures   Basic Metabolic Panel (BMET)   Magnesium   Amb Ref to Medical Weight Management   EKG 12-Lead   No orders of the defined types were placed in this encounter.   Patient Instructions  Medication Instructions:  Continue current medications *If you need a refill on your cardiac medications before your next appointment, please call your pharmacy*  Lab Work: Bmet, mg today If you have labs (blood work) drawn today and your tests are completely normal, you will receive your results only by: MyChart Message (if you have  MyChart) OR A paper copy in the mail If you have any lab test that is abnormal or we need to change your treatment, we will call you to review the results.  Testing/Procedures: none  Follow-Up: At [facility], you and your health needs are our priority.  As part of our continuing mission to provide you with exceptional heart care, our providers are all part of one team.  This team includes your primary Cardiologist (physician) and Advanced Practice Providers or APPs (Physician Assistants and Nurse Practitioners) who all work together to provide you with the care you need, when you need it.  Your next appointment:   6 month(s)  Provider:   Wendie Hamburg, MD    We recommend signing up for the patient portal called "MyChart".  Sign up information is provided on this After Visit Summary.  MyChart is used to connect with patients for Virtual Visits (Telemedicine).  Patients are able to view lab/test results, encounter notes, upcoming appointments, etc.  Non-urgent messages can be sent to your provider as well.   To learn more about what you can do with MyChart, go to [email or web address].   Other Instructions Referral to health weight and wellnes   Signed, Wendie Hamburg, MD  10/30/2023 6:44 PM    [facility]

## 2023-10-31 ENCOUNTER — Ambulatory Visit: Payer: Self-pay | Admitting: Cardiology

## 2023-10-31 LAB — BASIC METABOLIC PANEL WITH GFR
BUN/Creatinine Ratio: 25 (ref 12–28)
BUN: 13 mg/dL (ref 8–27)
CO2: 22 mmol/L (ref 20–29)
Calcium: 9 mg/dL (ref 8.7–10.3)
Chloride: 106 mmol/L (ref 96–106)
Creatinine, Ser: 0.51 mg/dL — ABNORMAL LOW (ref 0.57–1.00)
Glucose: 99 mg/dL (ref 70–99)
Potassium: 4.4 mmol/L (ref 3.5–5.2)
Sodium: 144 mmol/L (ref 134–144)
eGFR: 100 mL/min/{1.73_m2} (ref >59)

## 2023-10-31 LAB — MAGNESIUM: Magnesium: 2.3 mg/dL (ref 1.6–2.3)

## 2023-11-06 ENCOUNTER — Encounter (INDEPENDENT_AMBULATORY_CARE_PROVIDER_SITE_OTHER): Payer: Self-pay | Admitting: Cardiology

## 2023-11-06 DIAGNOSIS — G4719 Other hypersomnia: Secondary | ICD-10-CM

## 2023-11-13 ENCOUNTER — Ambulatory Visit: Attending: Cardiology

## 2023-11-13 DIAGNOSIS — G4719 Other hypersomnia: Secondary | ICD-10-CM

## 2023-11-13 DIAGNOSIS — R400 Somnolence: Secondary | ICD-10-CM

## 2023-11-13 NOTE — Procedures (Signed)
   SLEEP STUDY REPORT Patient Information Study Date: 11/06/2023 Patient Name: Theresa Hancock Patient ID: [id number] Birth Date: Dec 28, 1950 Age: 73 Gender: Female BMI: 36.9 (W=200 lb, H=5' 2'') Referring Physician: Carson Clara, MD  TEST DESCRIPTION: Home sleep apnea testing was completed using the WatchPat, a Type 1 device, utilizing peripheral arterial tonometry (PAT), chest movement, actigraphy, pulse oximetry, pulse rate, body position and snore. AHI was calculated with apnea and hypopnea using valid sleep time as the denominator. RDI includes apneas, hypopneas, and RERAs. The data acquired and the scoring of sleep and all associated events were performed in accordance with the recommended standards and specifications as outlined in the AASM Manual for the Scoring of Sleep and Associated Events 2.2.0 (2015).  FINDINGS: 1. No evidence of Obstructive Sleep Apnea with AHI 2.1/hr. 2. No Central Sleep Apnea. 3. Oxygen desaturations as low as 85%. 4. Mild to moderate snoring was present. O2 sats were < 88% for 0.3 minutes. 5. Total sleep time was 7 hrs and 9 min. 6. 23.5% of total sleep time was spent in REM sleep. 7. Normal sleep onset latency at 25 min. 8. Normal REM sleep onset latency at 96 min. 9. Total awakenings were 9.  DIAGNOSIS: Normal study with no significant sleep disordered breathing.  RECOMMENDATIONS: 1. Normal study with no significant sleep disordered breathing. 2. Healthy sleep recommendations include: adequate nightly sleep (normal 7-9 hrs/night), avoidance of caffeine after noon and alcohol near bedtime, and maintaining a sleep environment that is cool, dark and quiet. 3. Weight loss for overweight patients is recommended. 4. Snoring recommendations include: weight loss where appropriate, side sleeping, and avoidance of alcohol before bed. 5. Operation of motor vehicle or dangerous equipment must be avoided when feeling drowsy, excessively sleepy,  or mentally fatigued. 6. An ENT consultation which may be useful for specific causes of and possible treatment of bothersome snoring . 7. Weight loss may be of benefit in reducing the severity of snoring.   Signature: Gaylyn Keas, MD; [facility]; Diplomat, American Board of Sleep Medicine Electronically Signed: 11/13/2023 7:37:41 PM

## 2023-11-14 ENCOUNTER — Telehealth: Payer: Self-pay

## 2023-11-14 DIAGNOSIS — R400 Somnolence: Secondary | ICD-10-CM

## 2023-11-14 DIAGNOSIS — G4719 Other hypersomnia: Secondary | ICD-10-CM

## 2023-11-14 NOTE — Telephone Encounter (Signed)
**Note De-Identified Auther Lyerly Obfuscation** A WatchPAT One-HST was ordered and given to the pt at her 01/09/2023 office visit with Sr Alda Amas. We attempted to contact her X 3 as follows: Me    01/09/23 12:29 PM Note 3rd Attempt: Concerning the Itamar-HST Device, I called the pt but got no answer so I left a message on her VM asking her to call Tanis Fan back at Dr [employer] office at [facility] at [phone].   The pt was provided a Itamar-HST on 07/10/22 ordered by Dr Alda Amas. She was advised on 2/14 to proceed with the HST but per chart, was not done. On 5/16 the following letter was mailed to the pt: Oct 12, 2022  Theresa Hancock [address] [place] [address] Dear Ms. Barcelo,  This letter is to inform you that your [facility] Sleep Study needs to be completed or returned within 2 weeks of today's date. If the sleep study has not been done within this timeframe or the device has not been returned, then this will be handed over to our billing department per the signed waiver agreement.   If you have any questions or concerns, feel free to call us  at [phone].  She has not replied to the letter, did not proceed with her HST, and did not return the device back to the office in its original unopened box.   Forwarding this note to Dr Alda Amas for ok to cancel Itamar-HST        Per Dr Barnet Lias ok I canceled the Itamar-HST order.  The pt then proceeded with her test after a letter was mailed to her stating that we would have to charge her $100 if she did not proceed with the test or return it in its original unopened box.   I have re-ordered the Itamar-HST and the referral to link with her test.

## 2023-11-15 ENCOUNTER — Telehealth: Payer: Self-pay

## 2023-11-15 NOTE — Telephone Encounter (Signed)
 Notified patient of sleep study results and recommendations via VM, per DPR. Left callback number for questions and/or concerns.

## 2023-11-15 NOTE — Telephone Encounter (Signed)
-----   Message from Gaylyn Keas sent at 11/13/2023  7:38 PM EDT ----- Please let patient know that sleep study showed no significant sleep apnea.

## 2023-11-18 IMAGING — CT CT ANGIO CHEST
2 of 6 series · 18 of 46 positions shown · IV contrast (APPLIED)
Comparison: None Available.

CLINICAL DATA: Shortness of breath.

EXAM:
CT ANGIOGRAPHY CHEST WITH CONTRAST
TECHNIQUE: Multidetector CT imaging of the chest was performed using the
standard protocol during bolus administration of intravenous
contrast. Multiplanar CT image reconstructions and MIPs were
obtained to evaluate the vascular anatomy.

[Series 7: thins · axial · 0.72mm/px · z∈[+1121,+1394]mm · 15 of 428 slices shown]
[im 19/428  lung]
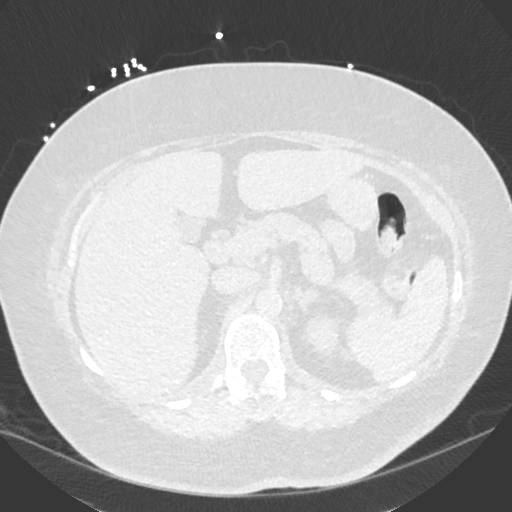
[im 56/428  soft-tissue]
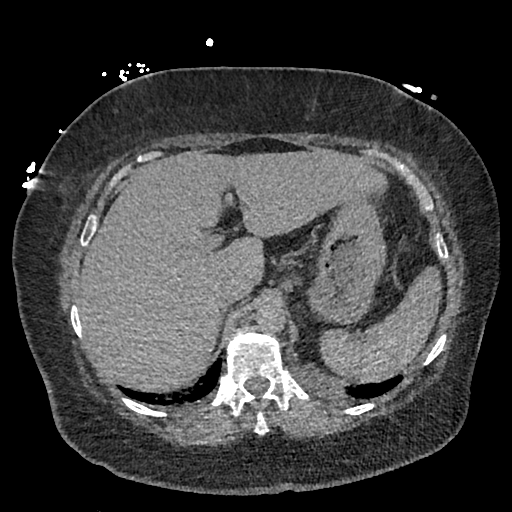
[im 75/428  lung]
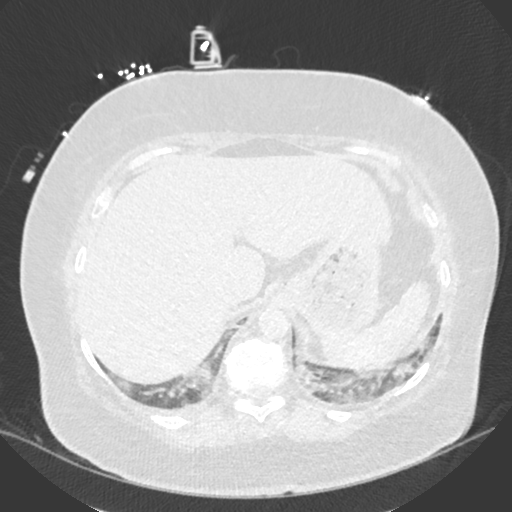
[im 112/428  soft-tissue]
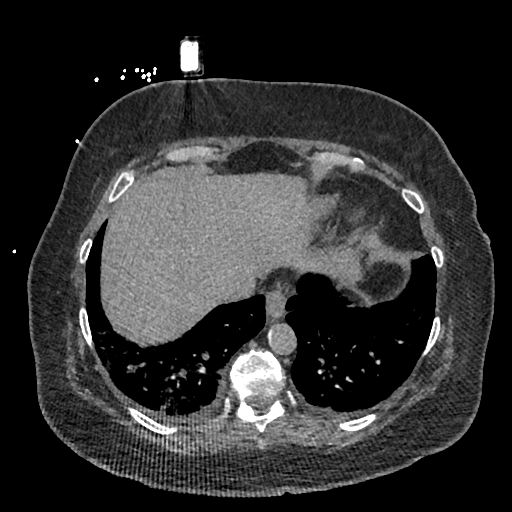
[im 130/428  lung]
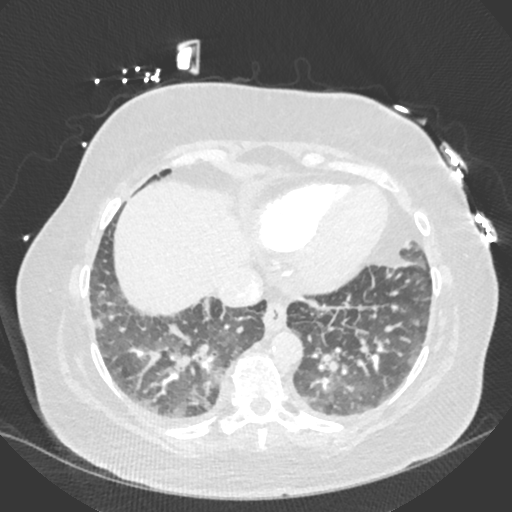
[im 168/428  soft-tissue]
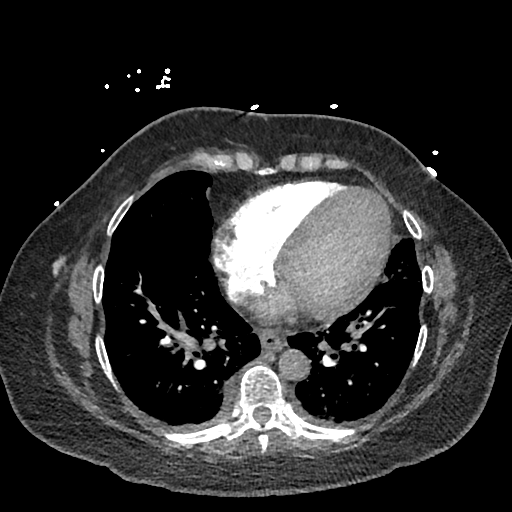
[im 186/428  lung]
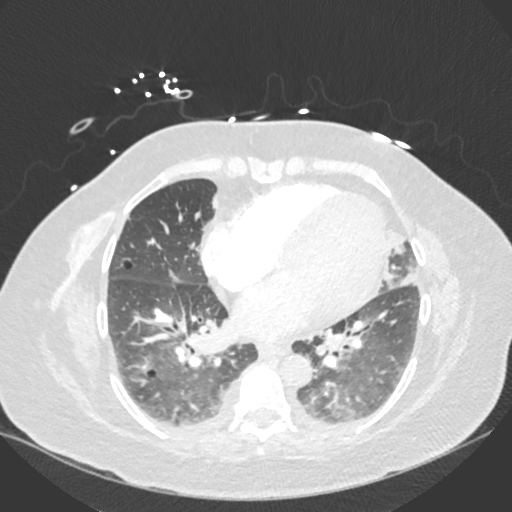
[im 223/428  soft-tissue]
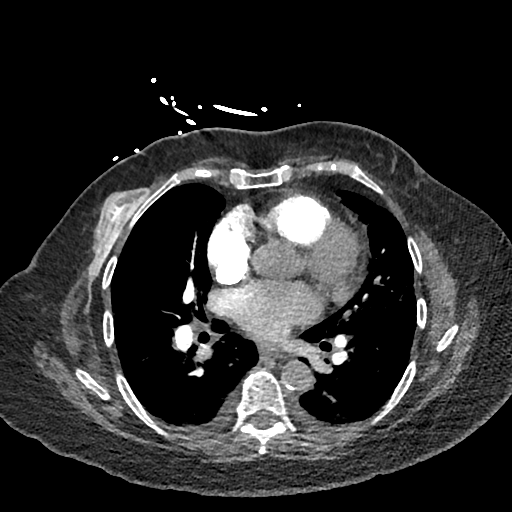
[im 242/428  lung]
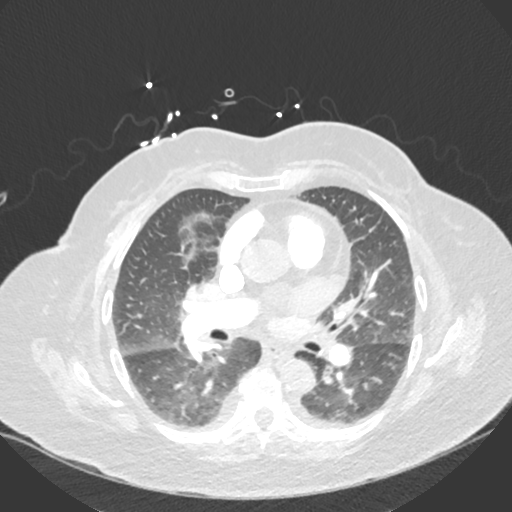
[im 260/428  soft-tissue]
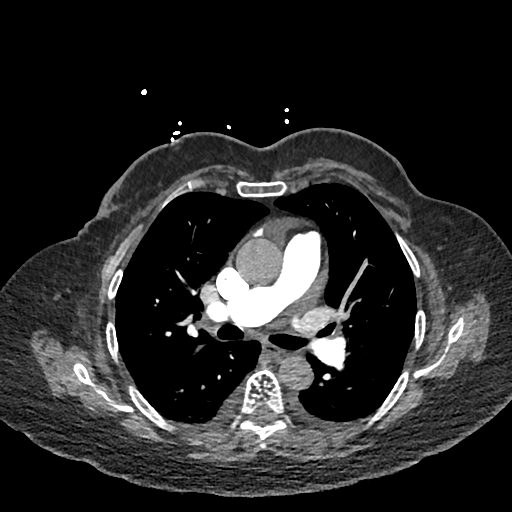
[im 298/428  lung]
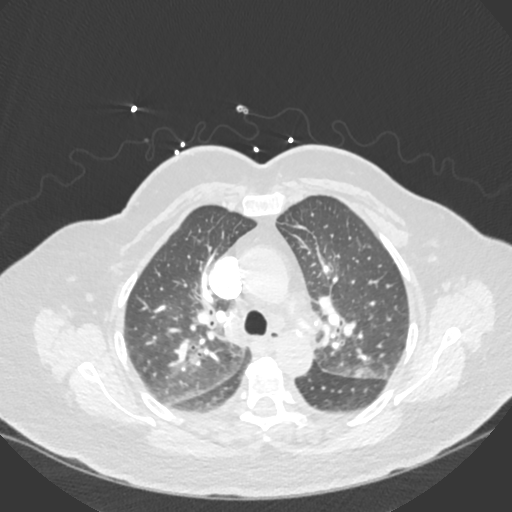
[im 316/428  soft-tissue]
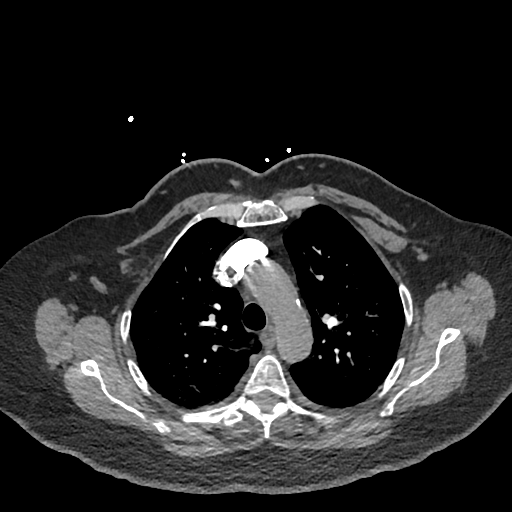
[im 353/428  lung]
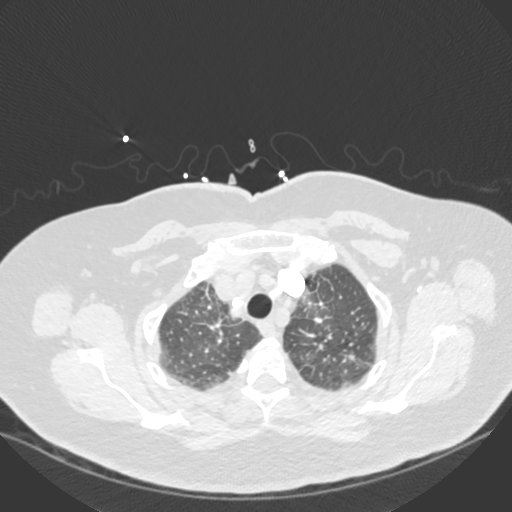
[im 372/428  soft-tissue]
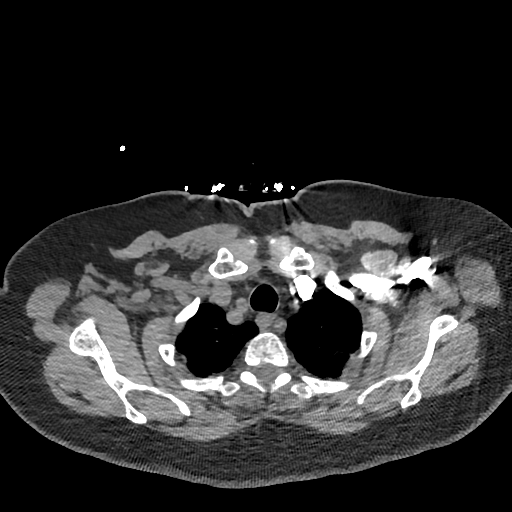
[im 409/428  lung]
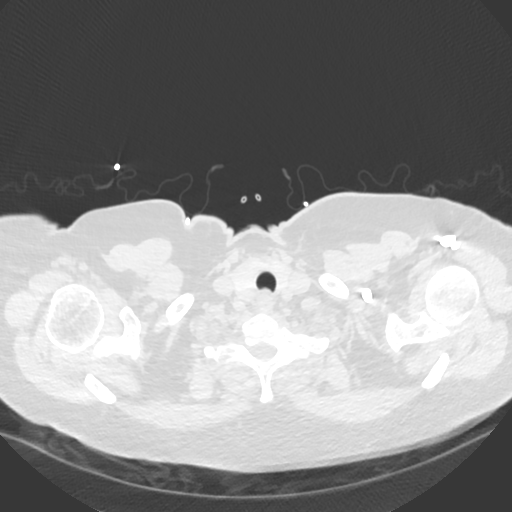

[Series 8: cor · coronal · 0.58mm/px · 3 of 160 slices shown]
[im 40/160  soft-tissue]
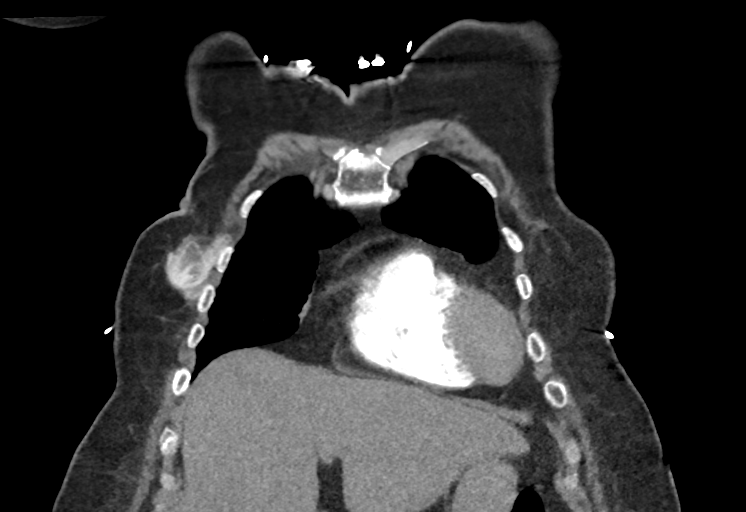
[im 80/160  soft-tissue]
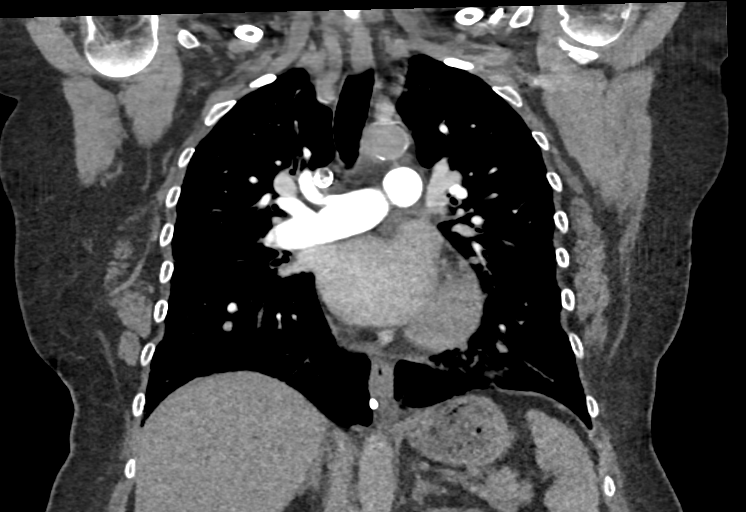
[im 120/160  soft-tissue]
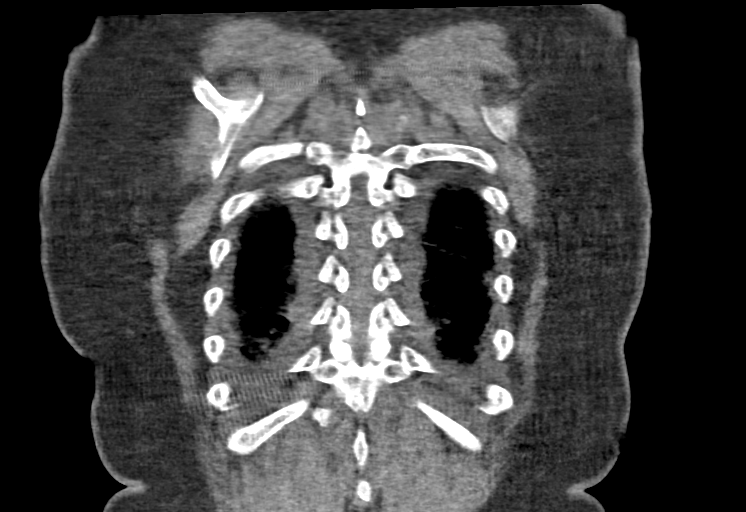

[18 of 46 positions shown; findings below may reference images not displayed]

RADIATION DOSE REDUCTION: This exam was performed according to the
departmental dose-optimization program which includes automated
exposure control, adjustment of the mA and/or kV according to
patient size and/or use of iterative reconstruction technique.

CONTRAST:  50mL OMNIPAQUE IOHEXOL 350 MG/ML SOLN
FINDINGS: Cardiovascular: Satisfactory opacification of the pulmonary arteries
to the segmental level. No evidence of pulmonary embolism. Normal
heart size. No pericardial effusion.

Mediastinum/Nodes: No enlarged mediastinal, hilar, or axillary lymph
nodes. Thyroid gland, trachea, and esophagus demonstrate no
significant findings.

Lungs/Pleura: Minimal bilateral pleural effusions are noted. No
pneumothorax is noted. Bibasilar opacities are noted concerning for
edema or possibly multifocal pneumonia.

Upper Abdomen: No acute abnormality.

Musculoskeletal: No chest wall abnormality. No acute or significant
osseous findings.

Review of the MIP images confirms the above findings.
IMPRESSION: No definite evidence of pulmonary embolus.

Bibasilar opacities are noted concerning for edema or possibly
multifocal pneumonia. Small pleural effusions are noted.

## 2023-11-22 ENCOUNTER — Other Ambulatory Visit: Payer: Self-pay | Admitting: Cardiology

## 2023-11-23 DIAGNOSIS — H35373 Puckering of macula, bilateral: Secondary | ICD-10-CM | POA: Diagnosis not present

## 2023-11-23 DIAGNOSIS — H43813 Vitreous degeneration, bilateral: Secondary | ICD-10-CM | POA: Diagnosis not present

## 2023-11-23 DIAGNOSIS — H35363 Drusen (degenerative) of macula, bilateral: Secondary | ICD-10-CM | POA: Diagnosis not present

## 2023-11-23 DIAGNOSIS — H59032 Cystoid macular edema following cataract surgery, left eye: Secondary | ICD-10-CM | POA: Diagnosis not present

## 2024-01-08 ENCOUNTER — Encounter: Payer: Self-pay | Admitting: Podiatry

## 2024-01-08 ENCOUNTER — Ambulatory Visit (INDEPENDENT_AMBULATORY_CARE_PROVIDER_SITE_OTHER): Admitting: Podiatry

## 2024-01-08 DIAGNOSIS — M79674 Pain in right toe(s): Secondary | ICD-10-CM

## 2024-01-08 DIAGNOSIS — B351 Tinea unguium: Secondary | ICD-10-CM

## 2024-01-08 DIAGNOSIS — M79675 Pain in left toe(s): Secondary | ICD-10-CM

## 2024-01-09 DIAGNOSIS — H59032 Cystoid macular edema following cataract surgery, left eye: Secondary | ICD-10-CM | POA: Diagnosis not present

## 2024-01-09 DIAGNOSIS — H35373 Puckering of macula, bilateral: Secondary | ICD-10-CM | POA: Diagnosis not present

## 2024-01-13 NOTE — Progress Notes (Signed)
  Subjective:  Patient ID: Theresa Hancock, female    DOB: 03-26-1951,  MRN: [id number]  73 y.o. female presents painful thick toenails that are difficult to trim. Pain interferes with ambulation. Aggravating factors include wearing enclosed shoe gear. Pain is relieved with periodic professional debridement.  Chief Complaint  Patient presents with   RFC    Rm17 Routine foot Care/ Dr. Oley  last visit May    New problem(s): None   PCP is Oley Bascom RAMAN, NP.  No Known Allergies  Review of Systems: Negative except as noted in the HPI.   Objective:  Theresa Hancock is a pleasant 73 y.o. female WD, WN in NAD. AAO x 3.  Vascular Examination: Capillary refill time immediate b/l. Vascular status intact b/l with palpable pedal pulses. Pedal hair present b/l. No pain with calf compression b/l. Skin temperature gradient WNL b/l. No cyanosis or clubbing b/l. No ischemia or gangrene noted b/l.   Neurological Examination: Sensation grossly intact b/l with 10 gram monofilament.  Dermatological Examination: Pedal skin with normal turgor, texture and tone b/l.  No open wounds. No interdigital macerations.   Toenails 1-5 b/l thick, discolored, elongated with subungual debris and pain on dorsal palpation.   No corns, calluses, nor porokeratotic lesions.  Musculoskeletal Examination: Muscle strength 5/5 to all lower extremity muscle groups bilaterally. No pain, crepitus or joint limitation noted with ROM b/l LE. No gross bony pedal deformities b/l. Patient ambulates independently without assistive aids.  Radiographs: None Assessment:   1. Pain due to onychomycosis of toenails of both feet    Plan:  Patient was evaluated and treated. All patient's and/or POA's questions/concerns addressed on today's visit. Toenails 1-5 debrided in length and girth without incident. Treatment was provided by assistant Andrez Manchester under my supervision. Continue soft, supportive shoe gear daily.  Report any pedal injuries to medical professional. Call office if there are any questions/concerns. -Patient/POA to call should there be question/concern in the interim.  Return in about 3 months (around 04/09/2024).  Delon LITTIE Merlin, DPM      [facility] LOCATION: [address] [place] [address]                   Office [phone]   [facility] LOCATION: [address] [place], [place] [address] Office [phone]

## 2024-02-18 ENCOUNTER — Telehealth: Payer: Self-pay | Admitting: Cardiology

## 2024-02-18 MED ORDER — SACUBITRIL-VALSARTAN 24-26 MG PO TABS: 1.0000 | ORAL_TABLET | Freq: Two times a day (BID) | ORAL | 3 refills | Status: AC

## 2024-02-18 NOTE — Telephone Encounter (Signed)
Prescription has been sent in. Patient is aware. 

## 2024-02-18 NOTE — Telephone Encounter (Signed)
*  STAT* If patient is at the pharmacy, call can be transferred to refill team.   1. Which medications need to be refilled? (please list name of each medication and dose if known) sacubitril -valsartan  (ENTRESTO ) 24-26 MG    4. Which pharmacy/location (including street and city if local pharmacy) is medication to be sent to?  [id number] - [place], [place] - [address] AT [address] [facility] [address]     5. Do they need a 30 day or 90 day supply? 90  Pt states they need the generic

## 2024-02-20 DIAGNOSIS — H35373 Puckering of macula, bilateral: Secondary | ICD-10-CM | POA: Diagnosis not present

## 2024-02-20 DIAGNOSIS — H35363 Drusen (degenerative) of macula, bilateral: Secondary | ICD-10-CM | POA: Diagnosis not present

## 2024-02-20 DIAGNOSIS — H59032 Cystoid macular edema following cataract surgery, left eye: Secondary | ICD-10-CM | POA: Diagnosis not present

## 2024-04-22 ENCOUNTER — Ambulatory Visit: Admitting: Podiatry

## 2024-04-22 DIAGNOSIS — Z91199 Patient's noncompliance with other medical treatment and regimen due to unspecified reason: Secondary | ICD-10-CM

## 2024-04-22 NOTE — Progress Notes (Signed)
 1. No-show for appointment

## 2024-05-10 ENCOUNTER — Other Ambulatory Visit: Payer: Self-pay | Admitting: Cardiology
